# Patient Record
Sex: Female | Born: 1975
Health system: Southern US, Community
[De-identification: ages and names within clinical notes are randomized; demographics above are authoritative.]

## PROBLEM LIST (undated history)

## (undated) DIAGNOSIS — Z789 Other specified health status: Secondary | ICD-10-CM

## (undated) HISTORY — PX: NO PAST SURGERIES: SHX2092

## (undated) HISTORY — DX: Other specified health status: Z78.9

---

## 2019-01-06 NOTE — L&D Delivery Note (Addendum)
Operative Delivery Note At  a viable female was delivered via .  Presentation: vertex; Position: Occiput,, Posterior; Station: +3.  Called to room due to maternal exhaustion.   Verbal consent: obtained from patient.  Risks and benefits discussed in detail.  Risks include, but are not limited to the risks of anesthesia, bleeding, infection, damage to maternal tissues, fetal cephalhematoma.  There is also the risk of inability to effect vaginal delivery of the head, or shoulder dystocia that cannot be resolved by established maneuvers, leading to the need for emergency cesarean section.  Baby delivered with 1 contraction, direct OP. Vacuum disengaged after delivery of head. Shoulders eased out. As there was thick meconium and no tone, cord clamped, cut, and baby taken to warmer, where resuscitative efforts begun.  APGAR: 1, 8, 9; weight 8#10oz  .   Placenta status: spontaneous, intact.   Cord:  with the following complications: .  Cord pH: 7.12  Anesthesia:  epidural Instruments: Mity Vac Episiotomy:   Lacerations:   2nd degree Suture Repair: 2.0 vicryl rapide Est. Blood Loss (mL):  600  Mom to postpartum.  Baby to Couplet care / Skin to Skin.  Levie Heritage 11/07/2019, 1:14 PM

## 2019-03-20 ENCOUNTER — Ambulatory Visit
Admission: EM | Admit: 2019-03-20 | Discharge: 2019-03-20 | Disposition: A | Discharge status: Home / Self Care | Payer: Medicaid Other

## 2019-03-20 ENCOUNTER — Encounter: Payer: Self-pay | Admitting: Emergency Medicine

## 2019-03-20 ENCOUNTER — Other Ambulatory Visit: Payer: Self-pay

## 2019-03-20 DIAGNOSIS — Z3201 Encounter for pregnancy test, result positive: Secondary | ICD-10-CM | POA: Diagnosis not present

## 2019-03-20 LAB — POCT URINE PREGNANCY: Preg Test, Ur: POSITIVE — AB

## 2019-03-20 MED ORDER — DOXYLAMINE-PYRIDOXINE 10-10 MG PO TBEC: DELAYED_RELEASE_TABLET | ORAL | 0 refills | Status: DC

## 2019-03-20 NOTE — ED Provider Notes (Signed)
[facility]    CSN: [id number] Arrival date & time: 03/20/19  1058      History   Chief Complaint Chief Complaint  Patient presents with  . Possible Pregnancy    HPI Tracey Hale is a 44 y.o. female.   44 year old female comes in for confirmation pregnancy test.  Patient states actively trying to conceive, and had positive home pregnancy test.  LMP 02/06/2019.  Has had some nausea without vomiting.  Intermittent headaches.  Denies weakness, dizziness, syncope.  Denies abdominal pain.  Denies vaginal bleeding, spotting.  Denies vaginal discharge, itching.     History reviewed. No pertinent past medical history.  There are no problems to display for this patient.   History reviewed. No pertinent surgical history.  OB History   No obstetric history on file.      Home Medications    Prior to Admission medications   Medication Sig Start Date End Date Taking? Authorizing Provider  omeprazole (PRILOSEC) 10 MG capsule Take 10 mg by mouth daily.  03/20/19 Yes [provider]  Doxylamine-Pyridoxine 10-10 MG TBEC Two tablets at bedtime on day 1 and 2; if symptoms persist, take 1 tablet in morning and 2 tablets at bedtime on day 3; if symptoms persist, may increase to 1 tablet in morning, 1 tablet mid-afternoon, and 2 tablets at bedtime on day 4. do not go over 4 tablets a day. 03/20/19   Ok Edwards, PA-C    Family History Family History  Problem Relation Age of Onset  . Hypertension Mother   . Hypertension Father     Social History Social History   Tobacco Use  . Smoking status: Never Smoker  . Smokeless tobacco: Never Used  Substance Use Topics  . Alcohol use: Not Currently  . Drug use: Never     Allergies   Patient has no known allergies.   Review of Systems Review of Systems  Reason unable to perform ROS: See HPI as above.     Physical Exam Triage Vital Signs ED Triage Vitals  Enc Vitals Group     BP 03/20/19 1106 97/67     Pulse  Rate 03/20/19 1106 65     Resp 03/20/19 1106 16     Temp 03/20/19 1106 98.2 F (36.8 C)     Temp Source 03/20/19 1106 Oral     SpO2 03/20/19 1106 99 %     Weight --      Height --      Head Circumference --      Peak Flow --      Pain Score 03/20/19 1111 0     Pain Loc --      Pain Edu? --      Excl. in [place]? --    No data found.  Updated Vital Signs BP 97/67 (BP Location: Left Arm)   Pulse 65   Temp 98.2 F (36.8 C) (Oral)   Resp 16   LMP 02/06/2019   SpO2 99%   Visual Acuity Right Eye Distance:   Left Eye Distance:   Bilateral Distance:    Right Eye Near:   Left Eye Near:    Bilateral Near:     Physical Exam Constitutional:      General: She is not in acute distress.    Appearance: She is well-developed. She is not ill-appearing, toxic-appearing or diaphoretic.  HENT:     Head: Normocephalic and atraumatic.  Eyes:     Conjunctiva/sclera: Conjunctivae normal.  Pupils: Pupils are equal, round, and reactive to light.  Cardiovascular:     Rate and Rhythm: Normal rate and regular rhythm.  Pulmonary:     Effort: Pulmonary effort is normal. No respiratory distress.     Comments: LCTAB Abdominal:     General: Bowel sounds are normal.     Palpations: Abdomen is soft.     Tenderness: There is no abdominal tenderness. There is no right CVA tenderness, left CVA tenderness, guarding or rebound.  Musculoskeletal:     Cervical back: Normal range of motion and neck supple.  Skin:    General: Skin is warm and dry.  Neurological:     Mental Status: She is alert and oriented to person, place, and time.  Psychiatric:        Behavior: Behavior normal.        Judgment: Judgment normal.      UC Treatments / Results  Labs (all labs ordered are listed, but only abnormal results are displayed) Labs Reviewed  POCT URINE PREGNANCY - Abnormal; Notable for the following components:      Result Value   Preg Test, Ur Positive (*)    All other components within normal  limits    EKG   Radiology No results found.  Procedures Procedures (including critical care time)  Medications Ordered in UC Medications - No data to display  Initial Impression / Assessment and Plan / UC Course  I have reviewed the triage vital signs and the nursing notes.  Pertinent labs & imaging results that were available during my care of the patient were reviewed by me and considered in my medical decision making (see chart for details).    Urine pregnancy positive.  Patient to discontinue omeprazole for now, and use antacids for acid reflux, and decrease spicy food at this time.  Can switch to Pepcid if symptoms not well controlled.  Patient to start prenatal vitamins at this time.  Diclegis as needed for nausea/vomiting.  Push fluids.  Otherwise patient to follow-up with OB/GYN for further evaluation and management needed.  Return precautions given.  Patient expresses understanding and agrees to plan.  Final Clinical Impressions(s) / UC Diagnoses   Final diagnoses:  Positive urine pregnancy test    ED Prescriptions    Medication Sig Dispense Auth. Provider   Doxylamine-Pyridoxine 10-10 MG TBEC Two tablets at bedtime on day 1 and 2; if symptoms persist, take 1 tablet in morning and 2 tablets at bedtime on day 3; if symptoms persist, may increase to 1 tablet in morning, 1 tablet mid-afternoon, and 2 tablets at bedtime on day 4. do not go over 4 tablets a day. 60 tablet Belinda Fisher, PA-C     PDMP not reviewed this encounter.   Belinda Fisher, PA-C 03/20/19 1203

## 2019-03-20 NOTE — ED Notes (Signed)
Patient able to ambulate independently  

## 2019-03-20 NOTE — Discharge Instructions (Signed)
Start prenatal vitamins. You can take over the counter antacids (tums, can also check with pharmacist for options) for acid reflux for now and decrease spicy foods. Diclegis as needed for nausea/vomiting. Keep hydrated, urine should be clear to pale yellow in color. Follow up with OB for further management needed. If noticing abdominal pain, vaginal bleeding, go to the [facility] emergency department for further evaluation.

## 2019-03-20 NOTE — ED Triage Notes (Signed)
Pt presents to [facility] for assessment after having a positive home pregnancy test.  LMP 2/1.

## 2019-03-25 ENCOUNTER — Encounter: Payer: Self-pay | Admitting: Physician Assistant

## 2019-03-25 ENCOUNTER — Ambulatory Visit
Admission: EM | Admit: 2019-03-25 | Discharge: 2019-03-25 | Disposition: A | Discharge status: Home / Self Care | Payer: Medicaid Other | Attending: Physician Assistant | Admitting: Physician Assistant

## 2019-03-25 ENCOUNTER — Other Ambulatory Visit: Payer: Self-pay

## 2019-03-25 DIAGNOSIS — Z3A00 Weeks of gestation of pregnancy not specified: Secondary | ICD-10-CM | POA: Diagnosis not present

## 2019-03-25 DIAGNOSIS — O219 Vomiting of pregnancy, unspecified: Secondary | ICD-10-CM

## 2019-03-25 MED ORDER — ONDANSETRON 4 MG PO TBDP: 4.0000 mg | ORAL_TABLET | Freq: Three times a day (TID) | ORAL | 0 refills | Status: DC | PRN

## 2019-03-25 NOTE — Discharge Instructions (Signed)
Discontinue diclegis. Start zofran as needed. Pick up prenatal vitamin to start. Follow up with OBGYN as scheduled. If still with uncontrolled nausea/vomiting despite medicine, abdominal pain, vaginal bleeding, go to the [facility] emergency department for further evaluation.

## 2019-03-25 NOTE — ED Provider Notes (Signed)
[facility]    CSN: [id number] Arrival date & time: 03/25/19  1407      History   Chief Complaint Chief Complaint  Patient presents with  . Nausea  . Emesis During Pregnancy    HPI Tracey Hale is a 44 y.o. female.   44 year old female returns to clinic after being seen 03/20/2019 for the same. At the time, she had confirmation of pregnancy via urine preg test. She was having nausea/vomiting due to pregnancy, but was still able to tolerate oral/fluid intake at times. She was started on diclegis, for which improved symptoms. However, she has had significant side effect with medicine, making her drowsy and "feeling like I'm drunk". She has not been able to work due to this side effects. She has had zofran in the past for her pregnancy, and would like to change medications. Denies abdominal pain, vaginal bleeding.      History reviewed. No pertinent past medical history.  There are no problems to display for this patient.   History reviewed. No pertinent surgical history.  OB History    Gravida  1   Para      Term      Preterm      AB      Living        SAB      TAB      Ectopic      Multiple      Live Births               Home Medications    Prior to Admission medications   Medication Sig Start Date End Date Taking? Authorizing Provider  ondansetron (ZOFRAN ODT) 4 MG disintegrating tablet Take 1 tablet (4 mg total) by mouth every 8 (eight) hours as needed for nausea or vomiting. 03/25/19   Tasia Catchings, Tahjir Silveria V, PA-C  omeprazole (PRILOSEC) 10 MG capsule Take 10 mg by mouth daily.  03/20/19  [provider]    Family History Family History  Problem Relation Age of Onset  . Hypertension Mother   . Hypertension Father     Social History Social History   Tobacco Use  . Smoking status: Never Smoker  . Smokeless tobacco: Never Used  Substance Use Topics  . Alcohol use: Not Currently  . Drug use: Never     Allergies   Patient has no  known allergies.   Review of Systems Review of Systems  Reason unable to perform ROS: See HPI as above.     Physical Exam Triage Vital Signs ED Triage Vitals  Enc Vitals Group     BP 03/25/19 1412 104/70     Pulse Rate 03/25/19 1412 73     Resp 03/25/19 1412 16     Temp 03/25/19 1412 98.3 F (36.8 C)     Temp Source 03/25/19 1412 Oral     SpO2 03/25/19 1412 98 %     Weight --      Height --      Head Circumference --      Peak Flow --      Pain Score 03/25/19 1422 0     Pain Loc --      Pain Edu? --      Excl. in [place]? --    No data found.  Updated Vital Signs BP 104/70 (BP Location: Left Arm)   Pulse 73   Temp 98.3 F (36.8 C) (Oral)   Resp 16   LMP 02/06/2019  SpO2 98%   Physical Exam Constitutional:      General: She is not in acute distress.    Appearance: Normal appearance. She is well-developed. She is not toxic-appearing or diaphoretic.  HENT:     Head: Normocephalic and atraumatic.  Eyes:     Conjunctiva/sclera: Conjunctivae normal.     Pupils: Pupils are equal, round, and reactive to light.  Pulmonary:     Effort: Pulmonary effort is normal. No respiratory distress.     Comments: Speaking in full sentences without difficulty Musculoskeletal:     Cervical back: Normal range of motion and neck supple.  Skin:    General: Skin is warm and dry.  Neurological:     Mental Status: She is alert and oriented to person, place, and time.      UC Treatments / Results  Labs (all labs ordered are listed, but only abnormal results are displayed) Labs Reviewed - No data to display  EKG   Radiology No results found.  Procedures Procedures (including critical care time)  Medications Ordered in UC Medications - No data to display  Initial Impression / Assessment and Plan / UC Course  I have reviewed the triage vital signs and the nursing notes.  Pertinent labs & imaging results that were available during my care of the patient were reviewed by me  and considered in my medical decision making (see chart for details).    Discontinue diclegis. Will switch to zofran. Start prenatal vitamin. Patient to follow up as scheduled for OBGYN appointment. Otherwise, return precautions given. Patient expresses understanding and agrees to plan.  Final Clinical Impressions(s) / UC Diagnoses   Final diagnoses:  Nausea/vomiting in pregnancy   ED Prescriptions    Medication Sig Dispense Auth. Provider   ondansetron (ZOFRAN ODT) 4 MG disintegrating tablet Take 1 tablet (4 mg total) by mouth every 8 (eight) hours as needed for nausea or vomiting. 20 tablet Belinda Fisher, PA-C     PDMP not reviewed this encounter.   Belinda Fisher, PA-C 03/25/19 1427

## 2019-03-25 NOTE — ED Triage Notes (Signed)
Patient presents with continued nausea and vomiting.  She states medicine she was given on 3/15 is not helping.

## 2019-04-17 ENCOUNTER — Other Ambulatory Visit: Payer: Self-pay

## 2019-04-17 ENCOUNTER — Ambulatory Visit (INDEPENDENT_AMBULATORY_CARE_PROVIDER_SITE_OTHER): Payer: Medicaid Other | Admitting: *Deleted

## 2019-04-17 DIAGNOSIS — O0990 Supervision of high risk pregnancy, unspecified, unspecified trimester: Secondary | ICD-10-CM

## 2019-04-17 DIAGNOSIS — O09529 Supervision of elderly multigravida, unspecified trimester: Secondary | ICD-10-CM

## 2019-04-17 MED ORDER — BLOOD PRESSURE KIT DEVI: 1.0000 | 0 refills | Status: DC | PRN

## 2019-04-17 NOTE — Progress Notes (Signed)
I connected with  Verneda Skill on 04/17/19 at  1:30 PM EDT by telephone and verified that I am speaking with the correct person using two identifiers.   I discussed the limitations, risks, security and privacy concerns of performing an evaluation and management service by telephone and the availability of in person appointments. I also discussed with the patient that there may be a patient responsible charge related to this service. The patient expressed understanding and agreed to proceed.  I explained I am completing her New OB Intake today. We discussed Her EDD and that it is based on  sure LMP . I reviewed her allergies, meds, OB History, Medical /Surgical history, and appropriate screenings. She is currently taking zofran as needed. I informed her we do not recommend zofran in the first trimester of pregnancy and offered to order another medication on our protocol. She declines at this time and will discuss with provider at her new ob appointment next week. She states that she prefers zofran because she took it with other pregnancy and it usually works best for her ; although not as good this pregnancy.  I informed her of [facility] services.  I explained I will send her the [employer] app and app was sent to her while on phone.  I explained we will send a blood pressure cuff to [facility] that will fill that prescription and we called [facility] to verify they received her prescription and confirmed they will deliver to her .  I asked her to bring the blood pressure cuff with her to her first ob appointment so we can show her how to use it. Explained  then we will have her take her blood pressure weekly and enter into the app. I explained she will have some visits in office and some virtually. 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 I reviewed her new ob  appointment date/ time with her , our location and to wear mask, no visitors.  I explained she will have a pelvic exam, ob bloodwork, hemoglobin a1C, cbg ,pap, and   genetic testing if desired,- she does want a panorama. I scheduled an [place] at 19 weeks and gave her the appointment. I also offered her genetic counseling due to her age  which she declined She voices understanding.   Emori Mumme,RN  04/17/2019  1:28 PM

## 2019-04-17 NOTE — Patient Instructions (Signed)

## 2019-04-18 NOTE — Progress Notes (Signed)
Patient was assessed by nursing staff during this encounter. I have reviewed the chart and agree with the documentation and plan.  Jaynie Collins, MD 04/18/2019 8:37 AM

## 2019-04-24 ENCOUNTER — Encounter: Payer: Self-pay | Admitting: Obstetrics & Gynecology

## 2019-04-24 ENCOUNTER — Ambulatory Visit (INDEPENDENT_AMBULATORY_CARE_PROVIDER_SITE_OTHER): Payer: Medicaid Other | Admitting: Obstetrics & Gynecology

## 2019-04-24 ENCOUNTER — Other Ambulatory Visit (HOSPITAL_COMMUNITY)
Admission: RE | Admit: 2019-04-24 | Discharge: 2019-04-24 | Disposition: A | Discharge status: Home / Self Care | Payer: Medicaid Other | Source: Ambulatory Visit | Attending: Obstetrics & Gynecology | Admitting: Obstetrics & Gynecology

## 2019-04-24 ENCOUNTER — Other Ambulatory Visit: Payer: Self-pay

## 2019-04-24 VITALS — BP 109/72 | HR 80 | Wt 137.5 lb

## 2019-04-24 DIAGNOSIS — O0991 Supervision of high risk pregnancy, unspecified, first trimester: Secondary | ICD-10-CM

## 2019-04-24 DIAGNOSIS — O99891 Other specified diseases and conditions complicating pregnancy: Secondary | ICD-10-CM

## 2019-04-24 DIAGNOSIS — O09521 Supervision of elderly multigravida, first trimester: Secondary | ICD-10-CM | POA: Diagnosis present

## 2019-04-24 DIAGNOSIS — O0990 Supervision of high risk pregnancy, unspecified, unspecified trimester: Secondary | ICD-10-CM

## 2019-04-24 DIAGNOSIS — Z3A11 11 weeks gestation of pregnancy: Secondary | ICD-10-CM | POA: Diagnosis not present

## 2019-04-24 DIAGNOSIS — R8271 Bacteriuria: Secondary | ICD-10-CM | POA: Diagnosis not present

## 2019-04-24 DIAGNOSIS — O219 Vomiting of pregnancy, unspecified: Secondary | ICD-10-CM

## 2019-04-24 DIAGNOSIS — O3680X0 Pregnancy with inconclusive fetal viability, not applicable or unspecified: Secondary | ICD-10-CM

## 2019-04-24 LAB — POCT URINALYSIS DIP (DEVICE)
Bilirubin Urine: NEGATIVE
Glucose, UA: NEGATIVE mg/dL
Hgb urine dipstick: NEGATIVE
Ketones, ur: NEGATIVE mg/dL
Leukocytes,Ua: NEGATIVE
Nitrite: NEGATIVE
Protein, ur: NEGATIVE mg/dL
Specific Gravity, Urine: >=1.03 (ref 1.005–1.030)
Urobilinogen, UA: 0.2 mg/dL (ref 0.0–1.0)
pH: 6.5 (ref 5.0–8.0)

## 2019-04-24 MED ORDER — ASPIRIN EC 81 MG PO TBEC: 81.0000 mg | DELAYED_RELEASE_TABLET | Freq: Every day | ORAL | 2 refills | Status: DC

## 2019-04-24 MED ORDER — ONDANSETRON 4 MG PO TBDP: 4.0000 mg | ORAL_TABLET | Freq: Three times a day (TID) | ORAL | 2 refills | Status: DC | PRN

## 2019-04-24 NOTE — Patient Instructions (Addendum)
AREA PEDIATRIC/FAMILY PRACTICE PHYSICIANS  [place] ([address]) . [facility] o Chambliss, MD; Eniola, MD; Hale, MD; Hensel, MD; McDiarmid, MD; McIntyer, MD; Neal, MD; Walden, MD o [address] [place], [place] [address] o [phone] o Mon-Fri 8:30-12:30, 1:30-5:00 o Providers come to see babies at [facility] o Accepting Medicaid . [facility] o Limited providers who accept newborns: Koirala, MD; Morrow, MD; Wolters, MD o [address], [place], [place] [address] o [phone] o Mon-Fri 8:00-5:30 o Babies seen by providers at [facility] o Does NOT accept Medicaid o Please call early in hospitalization for appointment (limited availability)  . [facility] o Mulberry, MD o [address] [place], [place] [address] o [phone] o Mon, Tue, Thur, Fri 8:30-5:00, Wed 10:00-7:00 (closed 1-2pm) o Babies seen by [facility] providers o Accepting Medicaid . [facility] - Pediatrician o Rubin, MD o [address], [place], [place] [address] o [phone] o Mon-Fri 8:30-5:00, Sat 8:30-12:00 o Provider comes to see babies at [facility] o Accepting Medicaid o Must have been referred from current patients or contacted office prior to delivery . [facility] ([facility]) o Brown, MD; Chandler, MD; Ettefagh, MD; Grant, MD; Lester, MD; McCormick, MD; McQueen, MD; Prose, MD; Simha, MD; Stanley, MD; Stryffeler, NP; Tebben, NP o [address], [place], [place] [address] o [phone] o Mon, Tue, Thur, Fri 8:30-5:30, Wed 9:30-5:30, Sat 8:30-12:30 o Babies seen by [facility] providers o Accepting Medicaid o Only accepting infants of first-time parents or siblings of current patients o Hospital discharge coordinator will make follow-up appointment . Jack Amos o [address],  [place], [place]  [address] o [phone]   Fax - [phone] . [facility] o [address], [place], [place]  [address] o Phone - [phone]   Fax - [phone] . Shilpa Gosrani o [address], [place], [place]  [address] o [phone]  [place] ([address]) . [facility] o Bates, MD; Brassfield, MD; Cooper, Cox, MD; MD; Davis, MD; Dovico, MD; Ettefaugh, MD; Little, MD; Lowe, MD; Keiffer, MD; Melvin, MD; Sumner, MD; Williams, MD o [address], [place], [place] [address] o [phone] o Mon-Fri 8:30-5:00 (extended evenings Mon-Thur as needed), Sat-Sun 10:00-1:00 o Providers come to see babies at [facility] o Accepting Medicaid for families of first-time babies and families with all children in the household age 3 and under. Must register with office prior to making appointment (M-F only). . [facility] o Henson, NP; Knapp, MD; Lalonde, MD; Tysinger, PA o [address] [place], [place] [address] o [phone] o Mon-Fri 8:00-5:00 o Babies seen by providers at [facility] o Does NOT accept Medicaid/Commercial Insurance Only . [facility] ([facility]  o Artis, MD; Barnes, MD; Bratton, MD; Coccaro, MD; Lockett Gardner, MD; Kramer, MD; Marshall, MD; Netherton, MD; Poleto, MD; Skinner, MD o [address] [place], [place] [address] o [phone] o Mon-Fri 8:30-5:30, Sat (Oct.-Mar.) 9:00-1:00 o Babies seen by providers at [facility] o Accepting Medicaid  [place] ([address]) . [facility] o Reid, MD; Warner, MD o [address], [place],  [address] o [phone] o Mon-Fri 8:30-5:00, Sat 8:30-12:00 o Providers come to see babies at [facility] o Does NOT accept Medicaid . [facility] o Becker, PA; Hagler, MD; Scifres, PA; Sun, MD; Swayne, MD o [address],  [place], [place] [address] o [phone] o Mon-Fri 8:00-5:00 o Babies seen by providers at [facility] o Does NOT accept Medicaid o Only accepting babies of parents who are patients o Please call early in hospitalization for appointment (limited availability) . [facility] o Clark, MD; Frye, MD; Kelleher, MD; Mack, NP; Miller, MD; O'Keller, MD; Patterson, NP; Pudlo, MD; Puzio, MD; Thomas, MD; Tucker, MD; Twiselton, MD o [address], [place], [place] [address] o [phone] o Mon-Fri 8:00-5:00, Sat 9:00-12:00 o Providers come to see babies at [facility] o Does NOT accept Medicaid  [place] ([address]) . [facility] o Limited providers accepting new patients: Brake, NP; Wharton, PA o [address], [place], [place] [address] o [phone] o Mon-Fri 8:00-5:00 o Babies seen by providers at [facility] o Does NOT accept Medicaid o Only accepting babies of parents who are patients o Please call early in hospitalization for appointment (limited availability) . [facility] o Gay, MD; Quinlan, MD o [address] [place], [place] [address] o [phone] (press 1 to schedule appointment) o Mon-Fri 8:00-5:00 o Providers come to see babies at [facility] o Does NOT accept Medicaid . [facility] o Mazer, MD o [address] [place], [place] [address] o [phone] o Mon-Fri 8:30-5:00 (lunch 12:30-1:00), extended hours by appointment only Wed 5:00-6:30 o Babies seen by [facility] providers o Accepting Medicaid . [facility] o Banks, MD; Jordan, MD; Koberlein, MD o [address], [place], [place] [address] o [phone] o Mon-Fri 8:00-5:00 o Babies seen by [facility] providers o Does NOT accept Medicaid . [facility] o Parker, MD; Hunter, MD; Wallace, DO o [address] [place], [place]  [address] o [phone] o Mon-Fri 8:00-5:00 o Babies seen by [facility] providers o Does NOT accept Medicaid . [facility] o Brandon, PA; Brecken, PA; Christy, NP; Dees, MD; DeClaire, MD; DeWeese, MD; Hansen, NP; Mills, NP; Parrish, NP; Smoot, NP; Summer, MD; Vapne, MD o [address] [place], [place] [address] o [phone] o Mon-Fri 8:30-5:00, Sat 10:00-1:00 o Providers come to see babies at [facility] o Does NOT accept Medicaid o Free prenatal information session Tuesdays at 4:45pm . [facility] o Bouska, MD; Gordon, PA; Jeffery, PA; Weber, PA o [address] [place] [address] o [phone] o Mon-Fri 7:30-5:30 o Babies seen by [facility] providers . [facility] o [address], [place], [place], [place]  [address] o [phone]   Fax - [phone]  [place] ([phone]) . [facility] o Reese, MD o [address] [place], [place] [address] o [phone] o Mon-Thur 8:00-6:00 o Providers come to see babies at [facility] o Accepting Medicaid . [facility] o Anderson, NP; Badger, MD; Beal, PA; Spencer, PA o [address] [place],  [address] o [phone] o Mon-Thur 7:30-7:30, Fri 7:30-4:30 o Babies seen by [facility] providers o Accepting Medicaid . [facility] o Agbuya, MD; Klett, NP; Romgoolam, MD o [address], ,  [address] o [phone] o Mon-Fri 8:30-5:00, Sat 8:30-12:00 o Providers come to see babies at [facility] o Accepting Medicaid o Must have "Meet & Greet" appointment at office prior to delivery . [facility]  ([facility] ) o McCord,   MD; Juleen China, MD; Clydene Laming, [address], [place], [place] [address] o [phone] o Mon-Wed 8:00-6:00, Thur-Fri 8:00-5:00, Sat 9:00-12:00 o Providers come to  see babies at [facility] o Does NOT accept Medicaid o Only accepting siblings of current patients . [facility] [place], [place], [place], [place]  [address] o [phone]   Fax [phone] . [facility] [place] [address], [place], [place]  [address] o [phone]   Fax - Morton Gorman [phone] & [phone]) . Therapist, music at [place], DO; [place], [address] [place], [place] [address] o [phone] o Mon-Fri 7:00-5:00 o Babies seen by [facility] providers o Does NOT accept Medicaid . [place], MD; [place], [place]; [place], [place], [place], [place] [address] o [phone] o Mon-Fri 8:00-5:00 o Babies seen by [facility] providers o Accepting Medicaid . Lamont, MD; [place], [place]; [place], NP; [place], [place], [place], [place] [address] o [phone] o Mon-Fri 8:00-5:00 o Babies seen by providers at [facility] [place] [phone]) . [facility], [place] o [address] [place], [place] [address] o [phone] o Mon-Fri 8:00-5:00 o Babies seen by [facility] providers o Does NOT accept Medicaid o Limited availability, please call early in hospitalization to schedule follow-up . [facility] Leilani Merl, PA; Maisie Fus, MD; [place], MD; [place], [place]; Jeannine Kitten, MD; [place], [place] [facility] [address], [place], [place] [address] o [phone] o Mon-Fri 8:30-5:00, Sat 9:00-12:00 o Babies seen by providers at [facility] o Accepting Medicaid o Please register online then schedule online or call office o [email or web address] . [place] ([facility]  [place]) Kristian Covey, NP; Dwyane Dee, MD; Leonidas Romberg, PA o [address] [place], [place], [place] [address] o [phone] o Mon-Fri 8:00-5:00 o Babies seen by providers at [facility] o Accepting Medicaid . [place] ([facility]) Dairl Ponder, MD; Rayvon Char, NP; Melina Modena, MD o [address] [place], [place], [place] [address] o [phone] o Mon-Fri 8:00-5:30, Sat&Sun by appointment (phones open at 8:30) o Babies seen by [facility] providers o Accepting Medicaid o Must be a first-time baby or sibling of current patient . [place], [address], [place], [place]  [address] o [phone]   Fax - [phone]  [place] [phone] & [phone]) . [place], [place]; [place], [place]; Benjamine Mola, MD; [place], [place]; Harrell Lark, MD o [address] [place], [place] [address] o [phone] o Mon-Thur 8:00-7:00, Fri 8:00-5:00, Sat 8:00-12:00, Sun 9:00-12:00 o Babies seen by [facility] providers o Accepting Medicaid . [facility], MD; Ruthann Cancer, MD; [facility], MD o [address], [place], [place] [address] o [phone] o Mon-Thur 8:00-5:00 o Babies seen by providers at [facility] o Accepting Medicaid . [facility] [place], MD; Coe-Goins, MD; Amedeo Plenty, MD; Bobby Rumpf, MD; List, MD; Lavonia Drafts, MD; Ruthann Cancer, MD; Selinda Eon, MD; Audie Box, MD; Jim Like, MD; Christie Nottingham, MD; Hubbard Hartshorn, MD; Modena Nunnery, MD o [address] [place], [place]  [address] o [phone] o Mon-Fri 8:00-5:30, Sat (Oct.-Mar.) 9:00-1:00 o Babies seen by providers at [facility] o Accepting Medicaid o Must fill out new patient packet, available online at [email or web address] . [place] ([facility]) Barnabas Lister, NP; [place]on Kingfisher, NP; Claiborne Billings, NP; Rolla Plate, MD;  [place], [place]; Carola Rhine, MD; Tyron Russell, MD; Delia Chimes, NP o [address], [place], [place] [address] o [phone] o Mon-Thur 8:00-5:30, Fri 8:00-5:00 o Babies seen by providers at [place] [phone]) . [place], [place]; [place]inton, MD; Dennard Schaumann, MD; [place], [place] o [address] [place], [place] [address] o [phone] o Mon-Fri 8:00-5:00 o Babies seen by providers at [place] [phone]) . [place] [facility] [place], [place]; Olen Pel, MD; [place], [place], [place], [place] [address] o [phone] o Mon-Fri 8:00-5:00 o Babies seen by providers at [facility] o Does NOT accept Medicaid o Limited appointment availability, please call early in hospitalization  . Therapist, music at [place], [place]; [place], [place], [place] [address] o [phone] o Mon-Fri 8:00-5:00 o Babies seen by [facility] providers o Does NOT accept Medicaid . [facility] Su Grand, MD; Guy Sandifer, MD; [place], [place]; [place], [place] [address], [place], [place] [address] o [phone] o Mon-Fri 8:00-5:00 o After hours clinic [facility] [place], [place] [address]) [phone] Mon-Fri 5:00-8:00, Sat 12:00-6:00, Sun 10:00-4:00 o Babies seen by [facility] providers o Accepting Medicaid . [place] at [facility] o [address] [place], [place]  [address] o [phone]   Fax - [phone]  [place] [phone]) . Therapist, music at [facility], MD o [address] [place], [place], [place] [address] o [phone] o Mon-Fri 8:00-5:00 o Babies seen by [facility] providers o Does NOT accept Medicaid . [place] ([place] [facility] [place]) Bing Neighbors, MD o [address], [place], [place]  [address] o [phone] o Mon-Thur 8:00-7:00, Fri 8:00-5:00, Sat 8:00-12:00 o Babies seen by providers at [facility] o Accepting Medicaid - but does not have vaccinations in office (must be received elsewhere) o Limited availability, please call early in hospitalization  [place] ([address]) . [place], [place], [place] [address] o [phone]  Fax [phone]  First Trimester of Pregnancy The first trimester of pregnancy is from week 1 until the end of week 13 (months 1 through 3). A week after a sperm fertilizes an egg, the egg will implant on the wall of the uterus. This embryo will begin to develop into a baby. Genes from you and your partner will form the baby. The female genes will determine whether the baby will be a boy or a girl. At 6-8 weeks, the eyes and face will be formed, and the heartbeat can be seen on ultrasound. At the end of 12 weeks, all the baby's organs will be formed. Now that you are pregnant, you will want to do everything you can to have a healthy baby. Two of the most important things are to get good prenatal care and to follow your health care provider's instructions. Prenatal care is all the medical care you receive before the baby's birth. This care will help prevent,  find, and treat any problems during the pregnancy and childbirth. Body changes during your first trimester Your body goes through many changes during pregnancy. The changes vary from woman to woman.  You may gain or lose a couple of pounds at first.  You may feel sick to your stomach (nauseous) and you may throw up (vomit). If the vomiting is uncontrollable, call your health care provider.  You may tire easily.  You may develop headaches that can be relieved by medicines. All medicines should be approved by your health care provider.  You may urinate more often. Painful urination may mean you have a bladder infection.  You may develop  heartburn as a result of your pregnancy.  You may develop constipation because certain hormones are causing the muscles that push stool through your intestines to slow down.  You may develop hemorrhoids or swollen veins (varicose veins).  Your breasts may begin to grow larger and become tender. Your nipples may stick out more, and the tissue that surrounds them (areola) may become darker.  Your gums may bleed and may be sensitive to brushing and flossing.  Dark spots or blotches (chloasma, mask of pregnancy) may develop on your face. This will likely fade after the baby is born.  Your menstrual periods will stop.  You may have a loss of appetite.  You may develop cravings for certain kinds of food.  You may have changes in your emotions from day to day, such as being excited to be pregnant or being concerned that something may go wrong with the pregnancy and baby.  You may have more vivid and strange dreams.  You may have changes in your hair. These can include thickening of your hair, rapid growth, and changes in texture. Some women also have hair loss during or after pregnancy, or hair that feels dry or thin. Your hair will most likely return to normal after your baby is born. What to expect at prenatal visits During a routine prenatal visit:  You will be weighed to make sure you and the baby are growing normally.  Your blood pressure will be taken.  Your abdomen will be measured to track your baby's growth.  The fetal heartbeat will be listened to between weeks 10 and 14 of your pregnancy.  Test results from any previous visits will be discussed. Your health care provider may ask you:  How you are feeling.  If you are feeling the baby move.  If you have had any abnormal symptoms, such as leaking fluid, bleeding, severe headaches, or abdominal cramping.  If you are using any tobacco products, including cigarettes, chewing tobacco, and electronic cigarettes.  If you have  any questions. Other tests that may be performed during your first trimester include:  Blood tests to find your blood type and to check for the presence of any previous infections. The tests will also be used to check for low iron levels (anemia) and protein on red blood cells (Rh antibodies). Depending on your risk factors, or if you previously had diabetes during pregnancy, you may have tests to check for high blood sugar that affects pregnant women (gestational diabetes).  Urine tests to check for infections, diabetes, or protein in the urine.  An ultrasound to confirm the proper growth and development of the baby.  Fetal screens for spinal cord problems (spina bifida) and Down syndrome.  HIV (human immunodeficiency virus) testing. Routine prenatal testing includes screening for HIV, unless you choose not to have this test.  You may need other tests to make sure you and the baby are doing well. Follow these instructions at home: Medicines  Follow your health care provider's instructions regarding medicine use. Specific medicines may be either safe or unsafe to take during pregnancy.  Take a prenatal vitamin that contains at least 600 micrograms (mcg) of folic acid.  If you develop constipation, try taking a stool softener if your health care provider approves. Eating and drinking   Eat a balanced diet that includes fresh fruits and vegetables, whole grains, good sources of protein such as meat, eggs, or tofu, and low-fat dairy. Your health care provider will help you determine the amount of weight gain that is right for you.  Avoid raw meat and uncooked cheese. These carry germs that can cause birth defects in the baby.  Eating four or five small meals rather than three large meals a day may help relieve nausea and vomiting. If you start to feel nauseous, eating a few soda crackers can be helpful. Drinking liquids between meals, instead of during meals, also seems to help ease nausea  and vomiting.  Limit foods that are high in fat and processed sugars, such as fried and sweet foods.  To prevent constipation: ? Eat foods that are high in fiber, such as fresh fruits and vegetables, whole grains, and beans. ? Drink enough fluid to keep your urine clear or pale yellow. Activity  Exercise only as directed by your health care provider. Most women can continue their usual exercise routine during pregnancy. Try to exercise for 30 minutes at least 5 days a week. Exercising will help you: ? Control your weight. ? Stay in shape. ? Be prepared for labor and delivery.  Experiencing pain or cramping in the lower abdomen or lower back is a good sign that you should stop exercising. Check with your health care provider before continuing with normal exercises.  Try to avoid standing for long periods of time. Move your legs often if you must stand in one place for a long time.  Avoid heavy lifting.  Wear low-heeled shoes and practice good posture.  You may continue to have sex unless your health care provider tells you not to. Relieving pain and discomfort  Wear a good support bra to relieve breast tenderness.  Take warm sitz baths to soothe any pain or discomfort caused by hemorrhoids. Use hemorrhoid cream if your health care provider approves.  Rest with your legs elevated if you have leg cramps or low back pain.  If you develop varicose veins in your legs, wear support hose. Elevate your feet for 15 minutes, 3-4 times a day. Limit salt in your diet. Prenatal care  Schedule your prenatal visits by the twelfth week of pregnancy. They are usually scheduled monthly at first, then more often in the last 2 months before delivery.  Write down your questions. Take them to your prenatal visits.  Keep all your prenatal visits as told by your health care provider. This is important. Safety  Wear your seat belt at all times when driving.  Make a list of emergency phone numbers,  including numbers for family, friends, the hospital, and police and fire departments. General instructions  Ask your health care provider for a referral to a local prenatal education class. Begin classes no later than the beginning of month 6 of your pregnancy.  Ask for help if you have counseling or nutritional needs during pregnancy. Your health care provider can offer advice or refer you  to specialists for help with various needs.  Do not use hot tubs, steam rooms, or saunas.  Do not douche or use tampons or scented sanitary pads.  Do not cross your legs for long periods of time.  Avoid cat litter boxes and soil used by cats. These carry germs that can cause birth defects in the baby and possibly loss of the fetus by miscarriage or stillbirth.  Avoid all smoking, herbs, alcohol, and medicines not prescribed by your health care provider. Chemicals in these products affect the formation and growth of the baby.  Do not use any products that contain nicotine or tobacco, such as cigarettes and e-cigarettes. If you need help quitting, ask your health care provider. You may receive counseling support and other resources to help you quit.  Schedule a dentist appointment. At home, brush your teeth with a soft toothbrush and be gentle when you floss. Contact a health care provider if:  You have dizziness.  You have mild pelvic cramps, pelvic pressure, or nagging pain in the abdominal area.  You have persistent nausea, vomiting, or diarrhea.  You have a bad smelling vaginal discharge.  You have pain when you urinate.  You notice increased swelling in your face, hands, legs, or ankles.  You are exposed to fifth disease or chickenpox.  You are exposed to [place] measles (rubella) and have never had it. Get help right away if:  You have a fever.  You are leaking fluid from your vagina.  You have spotting or bleeding from your vagina.  You have severe abdominal cramping or  pain.  You have rapid weight gain or loss.  You vomit blood or material that looks like coffee grounds.  You develop a severe headache.  You have shortness of breath.  You have any kind of trauma, such as from a fall or a car accident. Summary  The first trimester of pregnancy is from week 1 until the end of week 13 (months 1 through 3).  Your body goes through many changes during pregnancy. The changes vary from woman to woman.  You will have routine prenatal visits. During those visits, your health care provider will examine you, discuss any test results you may have, and talk with you about how you are feeling. This information is not intended to replace advice given to you by your health care provider. Make sure you discuss any questions you have with your health care provider. Document Revised: 12/04/2016 Document Reviewed: 12/04/2015 Elsevier Patient Education  2020 [employer].

## 2019-04-24 NOTE — Progress Notes (Signed)
History:   Tracey Hale is a 44 y.o. P7T0626 at 24w0dby LMP being seen today for her first obstetrical visit.  Her obstetrical history is significant for advanced maternal age and two term uncomplicated pregnancies culminating in vaginal deliveries. Patient does intend to breast feed. Pregnancy history fully reviewed.  Patient reports nausea. Desires refill of Zofran, this works well for her.       HISTORY: OB History  Gravida Para Term Preterm AB Living  '4 2 2 '$ 0 1 2  SAB TAB Ectopic Multiple Live Births  1 0 0 0 2    # Outcome Date GA Lbr Len/2nd Weight Sex Delivery Anes PTL Lv  4 Current           3 Term 11/15/10 443w0d8 lb 7 oz (3.827 kg) M Vag-Spont EPI  LIV     Birth Comments: wnl  2 Term 12/2007 4015w0d lb 15 oz (4.054 kg) M Vag-Spont EPI  LIV     Birth Comments: wnl  1 SAB 2007 5w062w0d       Last pap smear was done in 2019 in [place] was normal  History reviewed. No pertinent past medical history. History reviewed. No pertinent surgical history. Family History  Problem Relation Age of Onset  . Hypertension Mother   . Hypertension Father    Social History   Tobacco Use  . Smoking status: Never Smoker  . Smokeless tobacco: Never Used  Substance Use Topics  . Alcohol use: Not Currently    Comment: occasionally  . Drug use: Never   No Known Allergies Current Outpatient Medications on File Prior to Visit  Medication Sig Dispense Refill  . Prenatal MV & Min w/FA-DHA (PRENATAL ADULT GUMMY/DHA/FA PO) Take 1 tablet by mouth.    . Blood Pressure Monitoring (BLOOD PRESSURE KIT) DEVI 1 Device by Does not apply route as needed. (Patient not taking: Reported on 04/24/2019) 1 each 0  . [DISCONTINUED] omeprazole (PRILOSEC) 10 MG capsule Take 10 mg by mouth daily.     No current facility-administered medications on file prior to visit.    Review of Systems Pertinent items noted in HPI and remainder of comprehensive ROS otherwise negative. Physical Exam:    Vitals:   04/24/19 0903  BP: 109/72  Pulse: 80  Weight: 137 lb 8 oz (62.4 kg)      Bedside Ultrasound for FHR check: 160 bpm Patient informed that the ultrasound is considered a limited obstetric ultrasound and is not intended to be a complete ultrasound exam.  Patient also informed that the ultrasound is not being completed with the intent of assessing for fetal or placental anomalies or any pelvic abnormalities.  Explained that the purpose of today's ultrasound is to assess for fetal heart rate.  Patient acknowledges the purpose of the exam and the limitations of the study.  Uterus:     Pelvic Exam: Perineum: no hemorrhoids, normal perineum   Vulva: normal external genitalia, no lesions   Vagina:  normal mucosa, normal discharge   Cervix: no lesions and normal, pap smear done, has ectropion and had mild bleeding after pap   Adnexa: normal adnexa and no mass, fullness, tenderness   Bony Pelvis: average  System: General: well-developed, well-nourished female in no acute distress   Breasts:  normal appearance, no masses or tenderness bilaterally   Skin: normal coloration and turgor, no rashes   Neurologic: oriented, normal, negative, normal mood   Extremities: normal strength, tone,  and muscle mass, ROM of all joints is normal   HEENT PERRLA, extraocular movement intact and sclera clear, anicteric   Mouth/Teeth mucous membranes moist, pharynx normal without lesions and dental hygiene good   Neck supple and no masses   Cardiovascular: regular rate and rhythm   Respiratory:  no respiratory distress, normal breath sounds   Abdomen: soft, non-tender; bowel sounds normal; no masses,  no organomegaly    Assessment:    Pregnancy: [id number] Patient Active Problem List   Diagnosis Date Noted  . Supervision of high risk pregnancy, antepartum 04/17/2019  . AMA (advanced maternal age) multigravida 40+ 04/17/2019     Plan:    1. Nausea and vomiting during pregnancy Zofran refilled -  ondansetron (ZOFRAN ODT) 4 MG disintegrating tablet; Take 1 tablet (4 mg total) by mouth every 8 (eight) hours as needed for nausea or vomiting.  Dispense: 20 tablet; Refill: 2  2. Multigravida of advanced maternal age in first trimester Desires [employer] and first trimester anatomy scan. - Genetic Screening - US MFM Fetal Nuchal Translucency; Future  3. Supervision of high risk pregnancy, antepartum - Obstetric Panel, Including HIV - Culture, OB Urine - Genetic Screening - Hepatitis c antibody (reflex) - Cervicovaginal ancillary only - Comprehensive metabolic panel - Hemoglobin A1c - TSH - Protein / creatinine ratio, urine - [place] MFM OB DETAIL +14 WK; Future - Cytology - PAP - aspirin EC 81 MG tablet; Take 1 tablet (81 mg total) by mouth daily. Take after 12 weeks for prevention of preeclampsia later in pregnancy  Dispense: 300 tablet; Refill: 2 - [place] MFM Fetal Nuchal Translucency; Future  Initial labs drawn. Continue prenatal vitamins. Genetic Screening discussed, First trimester anatomy scan and NIPS: ordered. Ultrasound discussed; fetal anatomic survey: ordered. Problem list reviewed and updated. The nature of [place] with multiple MDs and other Advanced Practice Providers was explained to patient; also emphasized that residents, students are part of our team. Routine obstetric precautions reviewed. Return in about 4 weeks (around 05/22/2019) for OFFICE HOB Visit and AFP screen only.     Verita Schneiders, MD, [place] for [employer], [place]

## 2019-04-25 ENCOUNTER — Encounter: Payer: Self-pay | Admitting: *Deleted

## 2019-04-25 DIAGNOSIS — R87612 Low grade squamous intraepithelial lesion on cytologic smear of cervix (LGSIL): Secondary | ICD-10-CM | POA: Insufficient documentation

## 2019-04-25 LAB — CERVICOVAGINAL ANCILLARY ONLY
Bacterial Vaginitis (gardnerella): NEGATIVE
Candida Glabrata: NEGATIVE
Candida Vaginitis: NEGATIVE
Chlamydia: NEGATIVE
Comment: NEGATIVE
Comment: NEGATIVE
Comment: NEGATIVE
Comment: NEGATIVE
Comment: NEGATIVE
Comment: NORMAL
Neisseria Gonorrhea: NEGATIVE
Trichomonas: NEGATIVE

## 2019-04-25 LAB — HEMOGLOBIN A1C
Est. average glucose Bld gHb Est-mCnc: 103 mg/dL
Hgb A1c MFr Bld: 5.2 % (ref 4.8–5.6)

## 2019-04-25 LAB — OBSTETRIC PANEL, INCLUDING HIV
Antibody Screen: NEGATIVE
Basophils Absolute: 0.1 10*3/uL (ref 0.0–0.2)
Basos: 1 %
EOS (ABSOLUTE): 0.3 10*3/uL (ref 0.0–0.4)
Eos: 4 %
HIV Screen 4th Generation wRfx: NONREACTIVE
Hematocrit: 33.1 % — ABNORMAL LOW (ref 34.0–46.6)
Hemoglobin: 11 g/dL — ABNORMAL LOW (ref 11.1–15.9)
Hepatitis B Surface Ag: NEGATIVE
Immature Grans (Abs): 0 10*3/uL (ref 0.0–0.1)
Immature Granulocytes: 1 %
Lymphocytes Absolute: 1.6 10*3/uL (ref 0.7–3.1)
Lymphs: 21 %
MCH: 28.2 pg (ref 26.6–33.0)
MCHC: 33.2 g/dL (ref 31.5–35.7)
MCV: 85 fL (ref 79–97)
Monocytes Absolute: 0.8 10*3/uL (ref 0.1–0.9)
Monocytes: 10 %
Neutrophils Absolute: 4.9 10*3/uL (ref 1.4–7.0)
Neutrophils: 63 %
Platelets: 299 10*3/uL (ref 150–450)
RBC: 3.9 x10E6/uL (ref 3.77–5.28)
RDW: 13.9 % (ref 11.7–15.4)
RPR Ser Ql: NONREACTIVE
Rh Factor: POSITIVE
Rubella Antibodies, IGG: 11.1 index (ref >0.99)
WBC: 7.7 10*3/uL (ref 3.4–10.8)

## 2019-04-25 LAB — COMPREHENSIVE METABOLIC PANEL
ALT: 8 IU/L (ref 0–32)
AST: 15 IU/L (ref 0–40)
Albumin/Globulin Ratio: 1.5 (ref 1.2–2.2)
Albumin: 4.1 g/dL (ref 3.8–4.8)
Alkaline Phosphatase: 39 IU/L (ref 39–117)
BUN/Creatinine Ratio: 13 (ref 9–23)
BUN: 9 mg/dL (ref 6–24)
Bilirubin Total: 0.3 mg/dL (ref 0.0–1.2)
CO2: 20 mmol/L (ref 20–29)
Calcium: 9.1 mg/dL (ref 8.7–10.2)
Chloride: 102 mmol/L (ref 96–106)
Creatinine, Ser: 0.69 mg/dL (ref 0.57–1.00)
GFR calc Af Amer: 124 mL/min/{1.73_m2} (ref >59)
GFR calc non Af Amer: 108 mL/min/{1.73_m2} (ref >59)
Globulin, Total: 2.8 g/dL (ref 1.5–4.5)
Glucose: 78 mg/dL (ref 65–99)
Potassium: 3.8 mmol/L (ref 3.5–5.2)
Sodium: 136 mmol/L (ref 134–144)
Total Protein: 6.9 g/dL (ref 6.0–8.5)

## 2019-04-25 LAB — PROTEIN / CREATININE RATIO, URINE
Creatinine, Urine: 116.8 mg/dL
Protein, Ur: 9.7 mg/dL
Protein/Creat Ratio: 83 mg/g creat (ref 0–200)

## 2019-04-25 LAB — HEPATITIS C ANTIBODY (REFLEX): HCV Ab: <0.1 s/co ratio (ref 0.0–0.9)

## 2019-04-25 LAB — HCV COMMENT:

## 2019-04-25 LAB — TSH: TSH: 1.27 u[IU]/mL (ref 0.450–4.500)

## 2019-04-27 ENCOUNTER — Encounter: Payer: Self-pay | Admitting: Obstetrics & Gynecology

## 2019-04-27 LAB — CYTOLOGY - PAP
Comment: NEGATIVE
Comment: NEGATIVE
Comment: NEGATIVE
HPV 16: NEGATIVE
HPV 18 / 45: NEGATIVE
High risk HPV: POSITIVE — AB

## 2019-04-28 LAB — CULTURE, OB URINE

## 2019-04-28 LAB — URINE CULTURE, OB REFLEX

## 2019-04-28 MED ORDER — CEFADROXIL 500 MG PO CAPS: 500.0000 mg | ORAL_CAPSULE | Freq: Two times a day (BID) | ORAL | 0 refills | Status: DC

## 2019-04-28 NOTE — Addendum Note (Signed)
Addended by: Jaynie Collins A on: 04/28/2019 03:30 PM   Modules accepted: Orders

## 2019-05-02 ENCOUNTER — Encounter: Payer: Self-pay | Admitting: *Deleted

## 2019-05-02 ENCOUNTER — Telehealth: Payer: Self-pay

## 2019-05-02 NOTE — Telephone Encounter (Addendum)
-----   Message from Tereso Newcomer, MD sent at 04/28/2019  3:30 PM EDT ----- Urine culture showed bacteria in urine, antibiotic therapy (Cefadroxil) prescribed.  Please call to inform patient of results and advise her to pick up prescription and take as directed. Pt also needs to be informed that she needs a colposcopy which will be done at her appt on 05/22/19.  LM for pt that she has a UTI and an antibiotic has been sent to her [facility] on [address].  We also need to inform her of other results if she could please contact the office.  MyChart message sent.

## 2019-05-03 DIAGNOSIS — Z23 Encounter for immunization: Secondary | ICD-10-CM | POA: Diagnosis not present

## 2019-05-10 ENCOUNTER — Ambulatory Visit: Payer: Medicaid Other | Admitting: *Deleted

## 2019-05-10 ENCOUNTER — Ambulatory Visit: Payer: Medicaid Other | Attending: Obstetrics & Gynecology

## 2019-05-10 ENCOUNTER — Other Ambulatory Visit: Payer: Self-pay

## 2019-05-10 ENCOUNTER — Other Ambulatory Visit: Payer: Self-pay | Admitting: Obstetrics & Gynecology

## 2019-05-10 ENCOUNTER — Ambulatory Visit: Payer: Medicaid Other

## 2019-05-10 VITALS — BP 100/62 | HR 73 | Temp 98.3°F

## 2019-05-10 DIAGNOSIS — O09521 Supervision of elderly multigravida, first trimester: Secondary | ICD-10-CM | POA: Diagnosis not present

## 2019-05-10 DIAGNOSIS — Z3A13 13 weeks gestation of pregnancy: Secondary | ICD-10-CM

## 2019-05-10 DIAGNOSIS — O0990 Supervision of high risk pregnancy, unspecified, unspecified trimester: Secondary | ICD-10-CM

## 2019-05-10 DIAGNOSIS — Z3682 Encounter for antenatal screening for nuchal translucency: Secondary | ICD-10-CM

## 2019-05-10 DIAGNOSIS — Z363 Encounter for antenatal screening for malformations: Secondary | ICD-10-CM

## 2019-05-15 ENCOUNTER — Encounter: Payer: Self-pay | Admitting: *Deleted

## 2019-05-22 ENCOUNTER — Ambulatory Visit (INDEPENDENT_AMBULATORY_CARE_PROVIDER_SITE_OTHER): Payer: Medicaid Other | Admitting: Family Medicine

## 2019-05-22 ENCOUNTER — Other Ambulatory Visit: Payer: Self-pay

## 2019-05-22 ENCOUNTER — Encounter: Payer: Self-pay | Admitting: Family Medicine

## 2019-05-22 VITALS — BP 103/66 | HR 74 | Wt 141.9 lb

## 2019-05-22 DIAGNOSIS — Z3A15 15 weeks gestation of pregnancy: Secondary | ICD-10-CM

## 2019-05-22 DIAGNOSIS — R87612 Low grade squamous intraepithelial lesion on cytologic smear of cervix (LGSIL): Secondary | ICD-10-CM | POA: Diagnosis not present

## 2019-05-22 DIAGNOSIS — O0992 Supervision of high risk pregnancy, unspecified, second trimester: Secondary | ICD-10-CM | POA: Diagnosis not present

## 2019-05-22 DIAGNOSIS — O0990 Supervision of high risk pregnancy, unspecified, unspecified trimester: Secondary | ICD-10-CM

## 2019-05-22 DIAGNOSIS — O09522 Supervision of elderly multigravida, second trimester: Secondary | ICD-10-CM

## 2019-05-22 NOTE — Progress Notes (Signed)
   PRENATAL VISIT NOTE  Subjective:  Tracey Hale is a 44 y.o. S0F0932 at [redacted]w[redacted]d being seen today for ongoing prenatal care.  She is currently monitored for the following issues for this high-risk pregnancy and has Supervision of high risk pregnancy, antepartum; AMA (advanced maternal age) multigravida 42+; and Pap smear abnormality of cervix with LGSIL on their problem list.  Patient reports no complaints.  Contractions: Not present. Vag. Bleeding: None.  Movement: Absent. Denies leaking of fluid.   The following portions of the patient's history were reviewed and updated as appropriate: allergies, current medications, past family history, past medical history, past social history, past surgical history and problem list.   Objective:   Vitals:   05/22/19 0938  BP: 103/66  Pulse: 74  Weight: 141 lb 14.4 oz (64.4 kg)    Fetal Status: Fetal Heart Rate (bpm): 158   Movement: Absent     General:  Alert, oriented and cooperative. Patient is in no acute distress.  Skin: Skin is warm and dry. No rash noted.   Cardiovascular: Normal heart rate noted  Respiratory: Normal respiratory effort, no problems with respiration noted  Abdomen: Soft, gravid, appropriate for gestational age.  Pain/Pressure: Present     Pelvic: Cervical exam deferred        Extremities: Normal range of motion.  Edema: None  Mental Status: Normal mood and affect. Normal behavior. Normal judgment and thought content.   Assessment and Plan:  Pregnancy: T5T7322 at [redacted]w[redacted]d 1. Supervision of high risk pregnancy, antepartum FHT and FH normal  2. Pap smear abnormality of cervix with LGSIL Colpo today. See separate note  3. Multigravida of advanced maternal age in second trimester ASA 81mg  Daily  Preterm labor symptoms and general obstetric precautions including but not limited to vaginal bleeding, contractions, leaking of fluid and fetal movement were reviewed in detail with the patient. Please refer to After Visit Summary  for other counseling recommendations.   Return in about 4 weeks (around 06/19/2019) for HR OB f/u, In Office.  Future Appointments  Date Time Provider Department Center  06/19/2019  9:15 AM 06/21/2019, MD [facility]  06/19/2019 10:15 AM [facility] NURSE [facility]  06/19/2019 10:15 AM [facility] US2 [facility]    06/21/2019, DO

## 2019-05-22 NOTE — Progress Notes (Signed)
Patient Name: Tracey Hale, female   DOB: [birth year], 44 y.o.  MRN: [id number]  Colposcopy Procedure Note:  [id number] Pregnancy status: [redacted]w[redacted]d Indications: LSIL HPV:  Positive Cervical History:  Previous Abnormal Pap: none  Previous Colposcopy: none  Previous LEEP or Cryo: none  Smoking: Never Smoked Hysterectomy: No   Patient given informed consent, signed copy in the chart, time out was performed.    Exam: Vulva and Vagina grossly normal.  Cervix viewed with speculum and colposcope after application of acetic acid:  Cervix Fully Visualized Squamocolumnar Junction Visibility: Fully visualized, extropion at 12 o'clock Acetowhite lesions: none o'clock  Other Lesions: None Punctation: Not present  Mosaicism: Not present Abnormal vasculature: No   Biopsies: none  ECC: not done secondary to pregnancy  Hemostasis achieved with: n/a   Colposcopy Impression:  Benign

## 2019-06-03 ENCOUNTER — Emergency Department (HOSPITAL_COMMUNITY): Payer: Medicaid Other

## 2019-06-03 ENCOUNTER — Emergency Department (HOSPITAL_COMMUNITY)
Admission: EM | Admit: 2019-06-03 | Discharge: 2019-06-03 | Disposition: A | Discharge status: Home / Self Care | Payer: Medicaid Other | Attending: Emergency Medicine | Admitting: Emergency Medicine

## 2019-06-03 ENCOUNTER — Encounter (HOSPITAL_COMMUNITY): Payer: Self-pay

## 2019-06-03 ENCOUNTER — Other Ambulatory Visit: Payer: Self-pay

## 2019-06-03 DIAGNOSIS — R8271 Bacteriuria: Secondary | ICD-10-CM | POA: Diagnosis not present

## 2019-06-03 DIAGNOSIS — N760 Acute vaginitis: Secondary | ICD-10-CM | POA: Diagnosis not present

## 2019-06-03 DIAGNOSIS — Z3A17 17 weeks gestation of pregnancy: Secondary | ICD-10-CM | POA: Diagnosis not present

## 2019-06-03 DIAGNOSIS — O4690 Antepartum hemorrhage, unspecified, unspecified trimester: Secondary | ICD-10-CM

## 2019-06-03 DIAGNOSIS — O209 Hemorrhage in early pregnancy, unspecified: Secondary | ICD-10-CM | POA: Insufficient documentation

## 2019-06-03 DIAGNOSIS — R102 Pelvic and perineal pain: Secondary | ICD-10-CM | POA: Insufficient documentation

## 2019-06-03 LAB — CBC WITH DIFFERENTIAL/PLATELET
Abs Immature Granulocytes: 0.1 10*3/uL — ABNORMAL HIGH (ref 0.00–0.07)
Basophils Absolute: 0.1 10*3/uL (ref 0.0–0.1)
Basophils Relative: 1 %
Eosinophils Absolute: 0.2 10*3/uL (ref 0.0–0.5)
Eosinophils Relative: 2 %
HCT: 33 % — ABNORMAL LOW (ref 36.0–46.0)
Hemoglobin: 10.7 g/dL — ABNORMAL LOW (ref 12.0–15.0)
Immature Granulocytes: 1 %
Lymphocytes Relative: 17 %
Lymphs Abs: 1.5 10*3/uL (ref 0.7–4.0)
MCH: 28.8 pg (ref 26.0–34.0)
MCHC: 32.4 g/dL (ref 30.0–36.0)
MCV: 88.9 fL (ref 80.0–100.0)
Monocytes Absolute: 0.8 10*3/uL (ref 0.1–1.0)
Monocytes Relative: 8 %
Neutro Abs: 6.4 10*3/uL (ref 1.7–7.7)
Neutrophils Relative %: 71 %
Platelets: 275 10*3/uL (ref 150–400)
RBC: 3.71 MIL/uL — ABNORMAL LOW (ref 3.87–5.11)
RDW: 14.1 % (ref 11.5–15.5)
WBC: 9 10*3/uL (ref 4.0–10.5)
nRBC: 0 % (ref 0.0–0.2)

## 2019-06-03 LAB — URINALYSIS, ROUTINE W REFLEX MICROSCOPIC
Bilirubin Urine: NEGATIVE
Glucose, UA: NEGATIVE mg/dL
Ketones, ur: NEGATIVE mg/dL
Leukocytes,Ua: NEGATIVE
Nitrite: NEGATIVE
Protein, ur: NEGATIVE mg/dL
Specific Gravity, Urine: 1.005 (ref 1.005–1.030)
pH: 8 (ref 5.0–8.0)

## 2019-06-03 LAB — BASIC METABOLIC PANEL
Anion gap: 7 (ref 5–15)
BUN: 10 mg/dL (ref 6–20)
CO2: 25 mmol/L (ref 22–32)
Calcium: 8.8 mg/dL — ABNORMAL LOW (ref 8.9–10.3)
Chloride: 105 mmol/L (ref 98–111)
Creatinine, Ser: 0.71 mg/dL (ref 0.44–1.00)
GFR calc Af Amer: >60 mL/min (ref >60)
GFR calc non Af Amer: >60 mL/min (ref >60)
Glucose, Bld: 95 mg/dL (ref 70–99)
Potassium: 3.9 mmol/L (ref 3.5–5.1)
Sodium: 137 mmol/L (ref 135–145)

## 2019-06-03 LAB — WET PREP, GENITAL
Sperm: NONE SEEN
Trich, Wet Prep: NONE SEEN
Yeast Wet Prep HPF POC: NONE SEEN

## 2019-06-03 LAB — HCG, QUANTITATIVE, PREGNANCY: hCG, Beta Chain, Quant, S: 32563 m[IU]/mL — ABNORMAL HIGH (ref <5)

## 2019-06-03 LAB — RH IG WORKUP (INCLUDES ABO/RH)
ABO/RH(D): B POS
Gestational Age(Wks): 16

## 2019-06-03 LAB — TYPE AND SCREEN
ABO/RH(D): B POS
Antibody Screen: NEGATIVE

## 2019-06-03 LAB — ABO/RH: ABO/RH(D): B POS

## 2019-06-03 MED ORDER — CEPHALEXIN 500 MG PO CAPS
500.0000 mg | ORAL_CAPSULE | Freq: Three times a day (TID) | ORAL | 0 refills | Status: AC
Start: 2019-06-03 — End: 2019-06-08

## 2019-06-03 MED ORDER — METRONIDAZOLE 500 MG PO TABS
500.0000 mg | ORAL_TABLET | Freq: Two times a day (BID) | ORAL | 0 refills | Status: DC
Start: 2019-06-03 — End: 2019-07-04

## 2019-06-03 NOTE — ED Provider Notes (Signed)
[facility]-EMERGENCY DEPT Provider Note   CSN: [id number] Arrival date & time: 06/03/19  1208     History Chief Complaint  Patient presents with  . Vaginal Bleeding    Tracey Hale is a U1L2440 44 y.o. female at 16 weeks 5 days gestation presenting for evaluation of acute onset, persistent vaginal bleeding beginning today.  She reports that she awoke to note the vaginal bleeding.  She states that it is moderate severity, bright red.  She has not saturated a pad yet.  She also endorses lower abdominal cramping that has been present since her pregnancy began but a little worse today.  She has been experiencing nausea and vomiting with her pregnancy intermittently reports that it had significantly improved but worsened last night and she has had 2 episodes of nonbloody nonbilious emesis since yesterday.  Has also had right hip pressure and soreness since yesterday, denies any known trauma.  Pain worsens with sitting for prolonged periods of time and laying flat for prolonged periods of time.  Denies fevers, diarrhea, constipation, chest pain, shortness of breath.  She has not tried anything for her symptoms.  She is followed by Dr. Adrian Blackwater with OB/GYN.  She does report good fetal movement today.   The history is provided by the patient.       Past Medical History:  Diagnosis Date  . Medical history non-contributory     Patient Active Problem List   Diagnosis Date Noted  . Pap smear abnormality of cervix with LGSIL 04/25/2019  . Supervision of high risk pregnancy, antepartum 04/17/2019  . AMA (advanced maternal age) multigravida 40+ 04/17/2019    Past Surgical History:  Procedure Laterality Date  . NO PAST SURGERIES       OB History    Gravida  4   Para  2   Term  2   Preterm      AB  1   Living  2     SAB  1   TAB      Ectopic      Multiple      Live Births  2           Family History  Problem Relation Age of Onset  . Hypertension  Mother   . Hypertension Father     Social History   Tobacco Use  . Smoking status: Never Smoker  . Smokeless tobacco: Never Used  Substance Use Topics  . Alcohol use: Not Currently    Comment: occasionally  . Drug use: Never    Home Medications Prior to Admission medications   Medication Sig Start Date End Date Taking? Authorizing Provider  aspirin EC 81 MG tablet Take 1 tablet (81 mg total) by mouth daily. Take after 12 weeks for prevention of preeclampsia later in pregnancy 04/24/19  Yes Anyanwu, Jethro Bastos, MD  ondansetron (ZOFRAN ODT) 4 MG disintegrating tablet Take 1 tablet (4 mg total) by mouth every 8 (eight) hours as needed for nausea or vomiting. 04/24/19  Yes Anyanwu, Jethro Bastos, MD  Prenatal MV & Min w/FA-DHA (PRENATAL ADULT GUMMY/DHA/FA PO) Take 1 tablet by mouth.   Yes [provider]  cephALEXin (KEFLEX) 500 MG capsule Take 1 capsule (500 mg total) by mouth 3 (three) times daily for 5 days. 06/03/19 06/08/19  Michela Pitcher A, PA-C  metroNIDAZOLE (FLAGYL) 500 MG tablet Take 1 tablet (500 mg total) by mouth 2 (two) times daily. 06/03/19   Callahan Peddie A, PA-C  omeprazole (PRILOSEC) 10  MG capsule Take 10 mg by mouth daily.  03/20/19  [provider]    Allergies    Patient has no known allergies.  Review of Systems   Review of Systems  Constitutional: Negative for chills and fever.  Respiratory: Negative for shortness of breath.   Cardiovascular: Negative for chest pain.  Gastrointestinal: Positive for abdominal pain, nausea and vomiting.  Genitourinary: Positive for vaginal bleeding. Negative for dysuria, frequency and urgency.  Musculoskeletal: Positive for arthralgias.  Neurological: Negative for weakness and numbness.    Physical Exam Updated Vital Signs BP 106/72   Pulse 91   Temp 97.7 F (36.5 C) (Oral)   Resp 18   LMP 02/06/2019   SpO2 100%   Physical Exam Vitals and nursing note reviewed.  Constitutional:      General: She is not in acute  distress.    Appearance: She is well-developed.  HENT:     Head: Normocephalic and atraumatic.  Eyes:     General:        Right eye: No discharge.        Left eye: No discharge.     Conjunctiva/sclera: Conjunctivae normal.  Neck:     Vascular: No JVD.     Trachea: No tracheal deviation.  Cardiovascular:     Rate and Rhythm: Normal rate and regular rhythm.  Pulmonary:     Effort: Pulmonary effort is normal.     Breath sounds: Normal breath sounds.  Abdominal:     General: There is no distension.     Palpations: Abdomen is soft.     Tenderness: There is no abdominal tenderness. There is no right CVA tenderness, left CVA tenderness, guarding or rebound.     Comments: Abdomen gravid  Genitourinary:    Comments: Examination performed in the presence of a chaperone.  No masses or lesions to the external genitalia.  Moderate amount of bloody vaginal discharge noted.  Cervical os is closed.  No cervical motion tenderness or adnexal tenderness. Skin:    General: Skin is warm.     Findings: No erythema.  Neurological:     Mental Status: She is alert.  Psychiatric:        Behavior: Behavior normal.     ED Results / Procedures / Treatments   Labs (all labs ordered are listed, but only abnormal results are displayed) Labs Reviewed  WET PREP, GENITAL - Abnormal; Notable for the following components:      Result Value   Clue Cells Wet Prep HPF POC PRESENT (*)    WBC, Wet Prep HPF POC MANY (*)    All other components within normal limits  CBC WITH DIFFERENTIAL/PLATELET - Abnormal; Notable for the following components:   RBC 3.71 (*)    Hemoglobin 10.7 (*)    HCT 33.0 (*)    Abs Immature Granulocytes 0.10 (*)    All other components within normal limits  BASIC METABOLIC PANEL - Abnormal; Notable for the following components:   Calcium 8.8 (*)    All other components within normal limits  HCG, QUANTITATIVE, PREGNANCY - Abnormal; Notable for the following components:   hCG, Beta  Chain, Quant, S 32,563 (*)    All other components within normal limits  URINALYSIS, ROUTINE W REFLEX MICROSCOPIC - Abnormal; Notable for the following components:   Color, Urine STRAW (*)    Hgb urine dipstick MODERATE (*)    Bacteria, UA RARE (*)    All other components within normal limits  TYPE AND  SCREEN  RH IG WORKUP (INCLUDES ABO/RH)  ABO/RH  GC/CHLAMYDIA PROBE AMP ([employer]) NOT AT [facility]    EKG None  Radiology US OB Limited > 14 wks  Result Date: 06/03/2019 CLINICAL DATA:  Vaginal bleeding, known pregnancy EXAM: LIMITED OBSTETRIC ULTRASOUND AND TRANSVAGINAL OBSTETRIC ULTRASOUND COMPARISON:  05/10/2019 FINDINGS: Number of Fetuses: 1 Heart Rate:  159 bpm Movement: Present Presentation: Variable Placental Location: Anterior fundal Previa: Absent Amniotic Fluid (Subjective): Normal BPD:  3.9cm 17w 6d MATERNAL FINDINGS: Cervix:  Appears closed. Uterus/Adnexae:  Minimal free fluid is noted within the pelvis. IMPRESSION: Single live intrauterine gestation at 17 weeks 6 days. No acute abnormality is noted. This exam is performed on an emergent basis and does not comprehensively evaluate fetal size, dating, or anatomy; follow-up complete OB US should be considered if further fetal assessment is warranted. Electronically Signed   By: Alcide Clever M.D.   On: 06/03/2019 15:15   US OB Transvaginal  Result Date: 06/03/2019 CLINICAL DATA:  Vaginal bleeding, known pregnancy EXAM: LIMITED OBSTETRIC ULTRASOUND AND TRANSVAGINAL OBSTETRIC ULTRASOUND COMPARISON:  05/10/2019 FINDINGS: Number of Fetuses: 1 Heart Rate:  159 bpm Movement: Present Presentation: Variable Placental Location: Anterior fundal Previa: Absent Amniotic Fluid (Subjective): Normal BPD:  3.9cm 17w 6d MATERNAL FINDINGS: Cervix:  Appears closed. Uterus/Adnexae:  Minimal free fluid is noted within the pelvis. IMPRESSION: Single live intrauterine gestation at 17 weeks 6 days. No acute abnormality is noted. This exam is performed on an  emergent basis and does not comprehensively evaluate fetal size, dating, or anatomy; follow-up complete OB US should be considered if further fetal assessment is warranted. Electronically Signed   By: Alcide Clever M.D.   On: 06/03/2019 15:15    Procedures Procedures (including critical care time)  Medications Ordered in ED Medications - No data to display  ED Course  I have reviewed the triage vital signs and the nursing notes.  Pertinent labs & imaging results that were available during my care of the patient were reviewed by me and considered in my medical decision making (see chart for details).    MDM Rules/Calculators/A&P                      Patient presenting for evaluation of vaginal bleeding beginning today.  She is approximately [redacted] weeks pregnant.  She is afebrile, vital signs are stable.  She is nontoxic in appearance.  Examination of the abdomen yields no tenderness, rebound or guarding.  Pelvic examination shows small amount of vaginal bleeding, no cervical motion tenderness or adnexal tenderness and cervical os closed on my assessment.  Patient is also complaining of right hip pains but is neurovascularly intact with normal range of motion.  Doubt fracture or dislocation.  Discussed conservative therapy and management of hip pain with Tylenol gentle stretching exercises and follow-up with PCP or orthopedist.  With regards to the vaginal bleeding a pelvic ultrasound was obtained which shows single live intrauterine gestation at 17 weeks and 6 days with no acute abnormality noted.  Lab work reviewed and interpreted by myself shows no leukocytosis, mild anemia but appears to be at patient's baseline compared to lab work performed 1 month ago, no metabolic derangements or renal insufficiency.  Her quantitative hCG is elevated at 32,563.  Wet prep concerning for BV and UA does show some bacteria so we will treat with Flagyl and Keflex respectively. On reevaluation patient is resting  comfortably in no apparent distress.  She feels comfortable with discharge home at  this time.  We discussed importance of follow-up with OB/GYN on an outpatient basis ideally within 48 hours for recheck of hCG and possible repeat ultrasound.  Discussed strict ED return precautions.  Patient and husband verbalized understanding of and agreement with plan and patient is stable for discharge at this time.  Discussed with Dr. Kathrynn Humble who agrees with assessment and plan at this time.    Final Clinical Impression(s) / ED Diagnoses Final diagnoses:  Vaginal bleeding in pregnancy  BV (bacterial vaginosis)  Asymptomatic bacteriuria    Rx / DC Orders ED Discharge Orders         Ordered    metroNIDAZOLE (FLAGYL) 500 MG tablet  2 times daily     06/03/19 1751    cephALEXin (KEFLEX) 500 MG capsule  3 times daily     06/03/19 1751           Renita Papa, PA-C 06/04/19 [place], Ankit, MD 06/05/19 [phone]

## 2019-06-03 NOTE — Discharge Instructions (Signed)
Please take all of your antibiotics until finished!   Take your antibiotics with food.  Common side effects of antibiotics include nausea, vomiting, abdominal discomfort, and diarrhea. You may help offset some of this with probiotics which you can buy or get in yogurt. Do not eat  or take the probiotics until 2 hours after your antibiotic.    Continue taking all of your home medications as prescribed.  You can take Tylenol as needed for hip pain.  Do not take ibuprofen.  Go to the MAU (maternal assessment unit) at entrance C at [facility] in 48 hours for reevaluation.  They will likely recheck your pregnancy hormone levels and they may want to get a repeat ultrasound.  You can also call your OB/GYN to schedule close follow-up appointment.  Return to the emergency department if any concerning signs or symptoms develop such as fevers, worsening bleeding, weakness, loss of consciousness, persistent vomiting, or severe pain.

## 2019-06-03 NOTE — ED Triage Notes (Addendum)
Pt presents with c/o vaginal bleeding. LMP was 2/1, pt is approx 16 weeks and 5 days pregnant. Pt reports she has not noticed any clots, steady bleeding. Pt reporting lower abdominal pain with some right hip pain as well that just started yesterday.

## 2019-06-06 LAB — GC/CHLAMYDIA PROBE AMP (~~LOC~~) NOT AT ARMC
Chlamydia: NEGATIVE
Comment: NEGATIVE
Comment: NORMAL
Neisseria Gonorrhea: NEGATIVE

## 2019-06-19 ENCOUNTER — Other Ambulatory Visit: Payer: Self-pay

## 2019-06-19 ENCOUNTER — Ambulatory Visit: Payer: Medicaid Other | Admitting: *Deleted

## 2019-06-19 ENCOUNTER — Ambulatory Visit: Payer: Medicaid Other | Attending: Obstetrics and Gynecology

## 2019-06-19 ENCOUNTER — Other Ambulatory Visit: Payer: Self-pay | Admitting: *Deleted

## 2019-06-19 ENCOUNTER — Encounter: Payer: Medicaid Other | Admitting: Obstetrics and Gynecology

## 2019-06-19 VITALS — BP 96/68 | HR 77

## 2019-06-19 DIAGNOSIS — O09522 Supervision of elderly multigravida, second trimester: Secondary | ICD-10-CM | POA: Diagnosis not present

## 2019-06-19 DIAGNOSIS — O09529 Supervision of elderly multigravida, unspecified trimester: Secondary | ICD-10-CM

## 2019-06-19 DIAGNOSIS — Z363 Encounter for antenatal screening for malformations: Secondary | ICD-10-CM | POA: Diagnosis not present

## 2019-06-19 DIAGNOSIS — O0990 Supervision of high risk pregnancy, unspecified, unspecified trimester: Secondary | ICD-10-CM | POA: Diagnosis present

## 2019-06-19 DIAGNOSIS — O4692 Antepartum hemorrhage, unspecified, second trimester: Secondary | ICD-10-CM

## 2019-06-19 DIAGNOSIS — Z3A19 19 weeks gestation of pregnancy: Secondary | ICD-10-CM

## 2019-06-19 NOTE — Progress Notes (Signed)
Pt reports bleeding after intercourse last night. No bleeding or pain at this time.

## 2019-07-04 ENCOUNTER — Other Ambulatory Visit: Payer: Self-pay

## 2019-07-04 ENCOUNTER — Ambulatory Visit (INDEPENDENT_AMBULATORY_CARE_PROVIDER_SITE_OTHER): Payer: Medicaid Other | Admitting: Obstetrics and Gynecology

## 2019-07-04 ENCOUNTER — Encounter: Payer: Self-pay | Admitting: Obstetrics and Gynecology

## 2019-07-04 VITALS — BP 98/65 | HR 91 | Wt 147.3 lb

## 2019-07-04 DIAGNOSIS — O0992 Supervision of high risk pregnancy, unspecified, second trimester: Secondary | ICD-10-CM

## 2019-07-04 DIAGNOSIS — R87612 Low grade squamous intraepithelial lesion on cytologic smear of cervix (LGSIL): Secondary | ICD-10-CM

## 2019-07-04 DIAGNOSIS — O09522 Supervision of elderly multigravida, second trimester: Secondary | ICD-10-CM

## 2019-07-04 DIAGNOSIS — Z3A21 21 weeks gestation of pregnancy: Secondary | ICD-10-CM

## 2019-07-04 DIAGNOSIS — O0990 Supervision of high risk pregnancy, unspecified, unspecified trimester: Secondary | ICD-10-CM

## 2019-07-04 NOTE — Progress Notes (Signed)
   PRENATAL VISIT NOTE  Subjective:  Tracey Hale is a 44 y.o. J4H7026 at [redacted]w[redacted]d being seen today for ongoing prenatal care.  She is currently monitored for the following issues for this high-risk pregnancy and has Supervision of high risk pregnancy, antepartum; AMA (advanced maternal age) multigravida 64+; and Pap smear abnormality of cervix with LGSIL on their problem list.  Patient reports no complaints.  Contractions: Not present. Vag. Bleeding: None.  Movement: Present. Denies leaking of fluid.   The following portions of the patient's history were reviewed and updated as appropriate: allergies, current medications, past family history, past medical history, past social history, past surgical history and problem list.   Objective:   Vitals:   07/04/19 1042  BP: 98/65  Pulse: 91  Weight: 147 lb 4.8 oz (66.8 kg)    Fetal Status: Fetal Heart Rate (bpm): 140 Fundal Height: 21 cm Movement: Present     General:  Alert, oriented and cooperative. Patient is in no acute distress.  Skin: Skin is warm and dry. No rash noted.   Cardiovascular: Normal heart rate noted  Respiratory: Normal respiratory effort, no problems with respiration noted  Abdomen: Soft, gravid, appropriate for gestational age.  Pain/Pressure: Present     Pelvic: Cervical exam deferred        Extremities: Normal range of motion.  Edema: None  Mental Status: Normal mood and affect. Normal behavior. Normal judgment and thought content.   Assessment and Plan:  Pregnancy: V7C5885 at [redacted]w[redacted]d 1. Supervision of high risk pregnancy, antepartum Patient is doing well Anatomy ultrasound reviewed Patient declined AFP  2. Multigravida of advanced maternal age in second trimester Continue ASA  3. Pap smear abnormality of cervix with LGSIL Normal colposcopy from 06/19/19  Preterm labor symptoms and general obstetric precautions including but not limited to vaginal bleeding, contractions, leaking of fluid and fetal movement were  reviewed in detail with the patient. Please refer to After Visit Summary for other counseling recommendations.   Return in about 4 weeks (around 08/01/2019) for in person, ROB, High risk.  Future Appointments  Date Time Provider Department Center  07/18/2019 10:45 AM [facility] NURSE [facility]  07/18/2019 10:45 AM [facility] US4 [facility]    Catalina Antigua, MD

## 2019-07-17 ENCOUNTER — Encounter: Payer: Self-pay | Admitting: General Practice

## 2019-07-18 ENCOUNTER — Other Ambulatory Visit: Payer: Self-pay | Admitting: *Deleted

## 2019-07-18 ENCOUNTER — Other Ambulatory Visit: Payer: Self-pay

## 2019-07-18 ENCOUNTER — Ambulatory Visit: Payer: Medicaid Other | Attending: Obstetrics and Gynecology

## 2019-07-18 ENCOUNTER — Ambulatory Visit: Payer: Medicaid Other | Admitting: *Deleted

## 2019-07-18 VITALS — BP 100/73 | HR 102

## 2019-07-18 DIAGNOSIS — O0990 Supervision of high risk pregnancy, unspecified, unspecified trimester: Secondary | ICD-10-CM

## 2019-07-18 DIAGNOSIS — Z3A23 23 weeks gestation of pregnancy: Secondary | ICD-10-CM | POA: Diagnosis not present

## 2019-07-18 DIAGNOSIS — O09522 Supervision of elderly multigravida, second trimester: Secondary | ICD-10-CM

## 2019-07-18 DIAGNOSIS — Z362 Encounter for other antenatal screening follow-up: Secondary | ICD-10-CM | POA: Diagnosis not present

## 2019-07-18 DIAGNOSIS — O09529 Supervision of elderly multigravida, unspecified trimester: Secondary | ICD-10-CM | POA: Diagnosis not present

## 2019-08-03 ENCOUNTER — Other Ambulatory Visit: Payer: Self-pay

## 2019-08-03 ENCOUNTER — Ambulatory Visit (INDEPENDENT_AMBULATORY_CARE_PROVIDER_SITE_OTHER): Payer: Medicaid Other | Admitting: Obstetrics and Gynecology

## 2019-08-03 DIAGNOSIS — O09522 Supervision of elderly multigravida, second trimester: Secondary | ICD-10-CM

## 2019-08-03 NOTE — Progress Notes (Signed)
Prenatal Visit Note Date: 08/03/2019 Clinic: [facility]  Subjective:  Tracey Hale is a 44 y.o. [phone] at [redacted]w[redacted]d being seen today for ongoing prenatal care.  She is currently monitored for the following issues for this high-risk pregnancy and has Supervision of high risk pregnancy, antepartum; AMA (advanced maternal age) multigravida 79+; and Pap smear abnormality of cervix with LGSIL on their problem list.  Patient reports GERD, flushing with aspirin; she stopped it two days ago.   Contractions: Not present. Vag. Bleeding: None.  Movement: Present. Denies leaking of fluid.   The following portions of the patient's history were reviewed and updated as appropriate: allergies, current medications, past family history, past medical history, past social history, past surgical history and problem list. Problem list updated.  Objective:   Vitals:   08/03/19 1316  BP: (!) 112/63  Pulse: 88  Weight: 157 lb 1.6 oz (71.3 kg)    Fetal Status: Fetal Heart Rate (bpm): 153 Fundal Height: 26 cm Movement: Present     General:  Alert, oriented and cooperative. Patient is in no acute distress.  Skin: Skin is warm and dry. No rash noted.   Cardiovascular: Normal heart rate noted  Respiratory: Normal respiratory effort, no problems with respiration noted  Abdomen: Soft, gravid, appropriate for gestational age. Pain/Pressure: Present     Pelvic:  Cervical exam deferred        Extremities: Normal range of motion.  Edema: None  Mental Status: Normal mood and affect. Normal behavior. Normal judgment and thought content.   Urinalysis:      Assessment and Plan:  Pregnancy: [id number] at [redacted]w[redacted]d  1. Multigravida of advanced maternal age in second trimester Routine care. Okay to stop low dose asa. I also told her that she can try buying the name brand [employer]ayer aspirin to see if that has less reaction to it. 2h GTT nv. Undecided on BC. tums for gerd.   Preterm labor symptoms and  general obstetric precautions including but not limited to vaginal bleeding, contractions, leaking of fluid and fetal movement were reviewed in detail with the patient. Please refer to After Visit Summary for other counseling recommendations.  Return in about 2 weeks (around 08/17/2019) for high risk, 2hr GTT, low risk, in person.   [place] Bing, MD

## 2019-08-16 ENCOUNTER — Other Ambulatory Visit: Payer: Self-pay

## 2019-08-16 ENCOUNTER — Other Ambulatory Visit: Payer: Self-pay | Admitting: *Deleted

## 2019-08-16 ENCOUNTER — Ambulatory Visit: Payer: Medicaid Other | Admitting: *Deleted

## 2019-08-16 ENCOUNTER — Ambulatory Visit: Payer: Medicaid Other | Attending: Obstetrics and Gynecology

## 2019-08-16 VITALS — BP 101/72 | HR 84

## 2019-08-16 DIAGNOSIS — O09522 Supervision of elderly multigravida, second trimester: Secondary | ICD-10-CM

## 2019-08-16 DIAGNOSIS — O09529 Supervision of elderly multigravida, unspecified trimester: Secondary | ICD-10-CM | POA: Diagnosis not present

## 2019-08-16 DIAGNOSIS — Z3A27 27 weeks gestation of pregnancy: Secondary | ICD-10-CM | POA: Diagnosis not present

## 2019-08-16 DIAGNOSIS — Z362 Encounter for other antenatal screening follow-up: Secondary | ICD-10-CM | POA: Diagnosis not present

## 2019-08-17 ENCOUNTER — Other Ambulatory Visit: Payer: Self-pay | Admitting: *Deleted

## 2019-08-17 DIAGNOSIS — O0990 Supervision of high risk pregnancy, unspecified, unspecified trimester: Secondary | ICD-10-CM

## 2019-08-21 ENCOUNTER — Encounter: Payer: Self-pay | Admitting: Family Medicine

## 2019-08-21 ENCOUNTER — Ambulatory Visit (INDEPENDENT_AMBULATORY_CARE_PROVIDER_SITE_OTHER): Payer: Medicaid Other | Admitting: Family Medicine

## 2019-08-21 ENCOUNTER — Other Ambulatory Visit: Payer: Self-pay

## 2019-08-21 ENCOUNTER — Other Ambulatory Visit: Payer: Medicaid Other

## 2019-08-21 VITALS — BP 99/72 | HR 88 | Wt 158.0 lb

## 2019-08-21 DIAGNOSIS — Z23 Encounter for immunization: Secondary | ICD-10-CM

## 2019-08-21 DIAGNOSIS — O09523 Supervision of elderly multigravida, third trimester: Secondary | ICD-10-CM

## 2019-08-21 DIAGNOSIS — Z3A28 28 weeks gestation of pregnancy: Secondary | ICD-10-CM

## 2019-08-21 DIAGNOSIS — O0990 Supervision of high risk pregnancy, unspecified, unspecified trimester: Secondary | ICD-10-CM

## 2019-08-21 NOTE — Progress Notes (Signed)
   PRENATAL VISIT NOTE  Subjective:  Tracey Hale is a 44 y.o. [id number] at [redacted]w[redacted]d being seen today for ongoing prenatal care.  She is currently monitored for the following issues for this high-risk pregnancy and has Supervision of high risk pregnancy, antepartum; AMA (advanced maternal age) multigravida 37+; and Pap smear abnormality of cervix with LGSIL on their problem list.  Patient reports no complaints.  Contractions: Not present. Vag. Bleeding: None.  Movement: Present. Denies leaking of fluid.   The following portions of the patient's history were reviewed and updated as appropriate: allergies, current medications, past family history, past medical history, past social history, past surgical history and problem list.   Objective:   Vitals:   08/21/19 1033  BP: 99/72  Pulse: 88  Weight: 158 lb (71.7 kg)    Fetal Status: Fetal Heart Rate (bpm): 143 Fundal Height: 29 cm Movement: Present     General:  Alert, oriented and cooperative. Patient is in no acute distress.  Skin: Skin is warm and dry. No rash noted.   Cardiovascular: Normal heart rate noted  Respiratory: Normal respiratory effort, no problems with respiration noted  Abdomen: Soft, gravid, appropriate for gestational age.  Pain/Pressure: Present     Pelvic: Cervical exam deferred        Extremities: Normal range of motion.  Edema: None  Mental Status: Normal mood and affect. Normal behavior. Normal judgment and thought content.   Assessment and Plan:  Pregnancy: [id number] at [redacted]w[redacted]d 1. Supervision of high risk pregnancy, antepartum 28 wk labs and TDaP today - Tdap vaccine greater than or equal to 7yo IM  2. Multigravida of advanced maternal age in third trimester Normal NIPT, serial u/s for growth scheduled. Will need antenatal testing at 36 wks  Preterm labor symptoms and general obstetric precautions including but not limited to vaginal bleeding, contractions, leaking of fluid and fetal movement were reviewed in detail  with the patient. Please refer to After Visit Summary for other counseling recommendations.   Return in 2 weeks (on 09/04/2019) for [facility], in person.  Future Appointments  Date Time Provider Department Center  09/27/2019 10:30 AM [facility] NURSE [facility]  09/27/2019 10:45 AM [facility]    Reva Bores, MD

## 2019-08-21 NOTE — Patient Instructions (Signed)
 Contraception Choices Contraception, also called birth control, refers to methods or devices that prevent pregnancy. Hormonal methods Contraceptive implant  A contraceptive implant is a thin, plastic tube that contains a hormone. It is inserted into the upper part of the arm. It can remain in place for up to 3 years. Progestin-only injections Progestin-only injections are injections of progestin, a synthetic form of the hormone progesterone. They are given every 3 months by a health care provider. Birth control pills  Birth control pills are pills that contain hormones that prevent pregnancy. They must be taken once a day, preferably at the same time each day. Birth control patch  The birth control patch contains hormones that prevent pregnancy. It is placed on the skin and must be changed once a week for three weeks and removed on the fourth week. A prescription is needed to use this method of contraception. Vaginal ring  A vaginal ring contains hormones that prevent pregnancy. It is placed in the vagina for three weeks and removed on the fourth week. After that, the process is repeated with a new ring. A prescription is needed to use this method of contraception. Emergency contraceptive Emergency contraceptives prevent pregnancy after unprotected sex. They come in pill form and can be taken up to 5 days after sex. They work best the sooner they are taken after having sex. Most emergency contraceptives are available without a prescription. This method should not be used as your only form of birth control. Barrier methods Female condom  A female condom is a thin sheath that is worn over the penis during sex. Condoms keep sperm from going inside a woman's body. They can be used with a spermicide to increase their effectiveness. They should be disposed after a single use. Female condom  A female condom is a soft, loose-fitting sheath that is put into the vagina before sex. The condom keeps  sperm from going inside a woman's body. They should be disposed after a single use. Diaphragm  A diaphragm is a soft, dome-shaped barrier. It is inserted into the vagina before sex, along with a spermicide. The diaphragm blocks sperm from entering the uterus, and the spermicide kills sperm. A diaphragm should be left in the vagina for 6-8 hours after sex and removed within 24 hours. A diaphragm is prescribed and fitted by a health care provider. A diaphragm should be replaced every 1-2 years, after giving birth, after gaining more than 15 lb (6.8 kg), and after pelvic surgery. Cervical cap  A cervical cap is a round, soft latex or plastic cup that fits over the cervix. It is inserted into the vagina before sex, along with spermicide. It blocks sperm from entering the uterus. The cap should be left in place for 6-8 hours after sex and removed within 48 hours. A cervical cap must be prescribed and fitted by a health care provider. It should be replaced every 2 years. Sponge  A sponge is a soft, circular piece of polyurethane foam with spermicide on it. The sponge helps block sperm from entering the uterus, and the spermicide kills sperm. To use it, you make it wet and then insert it into the vagina. It should be inserted before sex, left in for at least 6 hours after sex, and removed and thrown away within 30 hours. Spermicides Spermicides are chemicals that kill or block sperm from entering the cervix and uterus. They can come as a cream, jelly, suppository, foam, or tablet. A spermicide should be inserted into   the vagina with an applicator at least 10-15 minutes before sex to allow time for it to work. The process must be repeated every time you have sex. Spermicides do not require a prescription. Intrauterine contraception Intrauterine device (IUD) An IUD is a T-shaped device that is put in a woman's uterus. There are two types:  Hormone IUD.This type contains progestin, a synthetic form of the  hormone progesterone. This type can stay in place for 3-5 years.  Copper IUD.This type is wrapped in copper wire. It can stay in place for 10 years.  Permanent methods of contraception Female tubal ligation In this method, a woman's fallopian tubes are sealed, tied, or blocked during surgery to prevent eggs from traveling to the uterus. Hysteroscopic sterilization In this method, a small, flexible insert is placed into each fallopian tube. The inserts cause scar tissue to form in the fallopian tubes and block them, so sperm cannot reach an egg. The procedure takes about 3 months to be effective. Another form of birth control must be used during those 3 months. Female sterilization This is a procedure to tie off the tubes that carry sperm (vasectomy). After the procedure, the man can still ejaculate fluid (semen). Natural planning methods Natural family planning In this method, a couple does not have sex on days when the woman could become pregnant. Calendar method This means keeping track of the length of each menstrual cycle, identifying the days when pregnancy can happen, and not having sex on those days. Ovulation method In this method, a couple avoids sex during ovulation. Symptothermal method This method involves not having sex during ovulation. The woman typically checks for ovulation by watching changes in her temperature and in the consistency of cervical mucus. Post-ovulation method In this method, a couple waits to have sex until after ovulation. Summary  Contraception, also called birth control, means methods or devices that prevent pregnancy.  Hormonal methods of contraception include implants, injections, pills, patches, vaginal rings, and emergency contraceptives.  Barrier methods of contraception can include female condoms, female condoms, diaphragms, cervical caps, sponges, and spermicides.  There are two types of IUDs (intrauterine devices). An IUD can be put in a woman's  uterus to prevent pregnancy for 3-5 years.  Permanent sterilization can be done through a procedure for males, females, or both.  Natural family planning methods involve not having sex on days when the woman could become pregnant. This information is not intended to replace advice given to you by your health care provider. Make sure you discuss any questions you have with your health care provider. Document Revised: 12/24/2016 Document Reviewed: 01/25/2016 Elsevier Patient Education  2020 [employer]   Breastfeeding  Choosing to breastfeed is one of the best decisions you can make for yourself and your baby. A change in hormones during pregnancy causes your breasts to make breast milk in your milk-producing glands. Hormones prevent breast milk from being released before your baby is born. They also prompt milk flow after birth. Once breastfeeding has begun, thoughts of your baby, as well as his or her sucking or crying, can stimulate the release of milk from your milk-producing glands. Benefits of breastfeeding Research shows that breastfeeding offers many health benefits for infants and mothers. It also offers a cost-free and convenient way to feed your baby. For your baby  Your first milk (colostrum) helps your baby's digestive system to function better.  Special cells in your milk (antibodies) help your baby to fight off infections.  Breastfed babies are   less likely to develop asthma, allergies, obesity, or type 2 diabetes. They are also at lower risk for sudden infant death syndrome (SIDS).  Nutrients in breast milk are better able to meet your baby's needs compared to infant formula.  Breast milk improves your baby's brain development. For you  Breastfeeding helps to create a very special bond between you and your baby.  Breastfeeding is convenient. Breast milk costs nothing and is always available at the correct temperature.  Breastfeeding helps to burn calories. It helps you  to lose the weight that you gained during pregnancy.  Breastfeeding makes your uterus return faster to its size before pregnancy. It also slows bleeding (lochia) after you give birth.  Breastfeeding helps to lower your risk of developing type 2 diabetes, osteoporosis, rheumatoid arthritis, cardiovascular disease, and breast, ovarian, uterine, and endometrial cancer later in life. Breastfeeding basics Starting breastfeeding  Find a comfortable place to sit or lie down, with your neck and back well-supported.  Place a pillow or a rolled-up blanket under your baby to bring him or her to the level of your breast (if you are seated). Nursing pillows are specially designed to help support your arms and your baby while you breastfeed.  Make sure that your baby's tummy (abdomen) is facing your abdomen.  Gently massage your breast. With your fingertips, massage from the outer edges of your breast inward toward the nipple. This encourages milk flow. If your milk flows slowly, you may need to continue this action during the feeding.  Support your breast with 4 fingers underneath and your thumb above your nipple (make the letter "C" with your hand). Make sure your fingers are well away from your nipple and your baby's mouth.  Stroke your baby's lips gently with your finger or nipple.  When your baby's mouth is open wide enough, quickly bring your baby to your breast, placing your entire nipple and as much of the areola as possible into your baby's mouth. The areola is the colored area around your nipple. ? More areola should be visible above your baby's upper lip than below the lower lip. ? Your baby's lips should be opened and extended outward (flanged) to ensure an adequate, comfortable latch. ? Your baby's tongue should be between his or her lower gum and your breast.  Make sure that your baby's mouth is correctly positioned around your nipple (latched). Your baby's lips should create a seal on your  breast and be turned out (everted).  It is common for your baby to suck about 2-3 minutes in order to start the flow of breast milk. Latching Teaching your baby how to latch onto your breast properly is very important. An improper latch can cause nipple pain, decreased milk supply, and poor weight gain in your baby. Also, if your baby is not latched onto your nipple properly, he or she may swallow some air during feeding. This can make your baby fussy. Burping your baby when you switch breasts during the feeding can help to get rid of the air. However, teaching your baby to latch on properly is still the best way to prevent fussiness from swallowing air while breastfeeding. Signs that your baby has successfully latched onto your nipple  Silent tugging or silent sucking, without causing you pain. Infant's lips should be extended outward (flanged).  Swallowing heard between every 3-4 sucks once your milk has started to flow (after your let-down milk reflex occurs).  Muscle movement above and in front of his or her   ears while sucking. Signs that your baby has not successfully latched onto your nipple  Sucking sounds or smacking sounds from your baby while breastfeeding.  Nipple pain. If you think your baby has not latched on correctly, slip your finger into the corner of your baby's mouth to break the suction and place it between your baby's gums. Attempt to start breastfeeding again. Signs of successful breastfeeding Signs from your baby  Your baby will gradually decrease the number of sucks or will completely stop sucking.  Your baby will fall asleep.  Your baby's body will relax.  Your baby will retain a small amount of milk in his or her mouth.  Your baby will let go of your breast by himself or herself. Signs from you  Breasts that have increased in firmness, weight, and size 1-3 hours after feeding.  Breasts that are softer immediately after breastfeeding.  Increased milk  volume, as well as a change in milk consistency and color by the fifth day of breastfeeding.  Nipples that are not sore, cracked, or bleeding. Signs that your baby is getting enough milk  Wetting at least 1-2 diapers during the first 24 hours after birth.  Wetting at least 5-6 diapers every 24 hours for the first week after birth. The urine should be clear or pale yellow by the age of 5 days.  Wetting 6-8 diapers every 24 hours as your baby continues to grow and develop.  At least 3 stools in a 24-hour period by the age of 5 days. The stool should be soft and yellow.  At least 3 stools in a 24-hour period by the age of 7 days. The stool should be seedy and yellow.  No loss of weight greater than 10% of birth weight during the first 3 days of life.  Average weight gain of 4-7 oz (113-198 g) per week after the age of 4 days.  Consistent daily weight gain by the age of 5 days, without weight loss after the age of 2 weeks. After a feeding, your baby may spit up a small amount of milk. This is normal. Breastfeeding frequency and duration Frequent feeding will help you make more milk and can prevent sore nipples and extremely full breasts (breast engorgement). Breastfeed when you feel the need to reduce the fullness of your breasts or when your baby shows signs of hunger. This is called "breastfeeding on demand." Signs that your baby is hungry include:  Increased alertness, activity, or restlessness.  Movement of the head from side to side.  Opening of the mouth when the corner of the mouth or cheek is stroked (rooting).  Increased sucking sounds, smacking lips, cooing, sighing, or squeaking.  Hand-to-mouth movements and sucking on fingers or hands.  Fussing or crying. Avoid introducing a pacifier to your baby in the first 4-6 weeks after your baby is born. After this time, you may choose to use a pacifier. Research has shown that pacifier use during the first year of a baby's life  decreases the risk of sudden infant death syndrome (SIDS). Allow your baby to feed on each breast as long as he or she wants. When your baby unlatches or falls asleep while feeding from the first breast, offer the second breast. Because newborns are often sleepy in the first few weeks of life, you may need to awaken your baby to get him or her to feed. Breastfeeding times will vary from baby to baby. However, the following rules can serve as a guide to   help you make sure that your baby is properly fed:  Newborns (babies 4 weeks of age or younger) may breastfeed every 1-3 hours.  Newborns should not go without breastfeeding for longer than 3 hours during the day or 5 hours during the night.  You should breastfeed your baby a minimum of 8 times in a 24-hour period. Breast milk pumping     Pumping and storing breast milk allows you to make sure that your baby is exclusively fed your breast milk, even at times when you are unable to breastfeed. This is especially important if you go back to work while you are still breastfeeding, or if you are not able to be present during feedings. Your lactation consultant can help you find a method of pumping that works best for you and give you guidelines about how long it is safe to store breast milk. Caring for your breasts while you breastfeed Nipples can become dry, cracked, and sore while breastfeeding. The following recommendations can help keep your breasts moisturized and healthy:  Avoid using soap on your nipples.  Wear a supportive bra designed especially for nursing. Avoid wearing underwire-style bras or extremely tight bras (sports bras).  Air-dry your nipples for 3-4 minutes after each feeding.  Use only cotton bra pads to absorb leaked breast milk. Leaking of breast milk between feedings is normal.  Use lanolin on your nipples after breastfeeding. Lanolin helps to maintain your skin's normal moisture barrier. Pure lanolin is not harmful (not  toxic) to your baby. You may also hand express a few drops of breast milk and gently massage that milk into your nipples and allow the milk to air-dry. In the first few weeks after giving birth, some women experience breast engorgement. Engorgement can make your breasts feel heavy, warm, and tender to the touch. Engorgement peaks within 3-5 days after you give birth. The following recommendations can help to ease engorgement:  Completely empty your breasts while breastfeeding or pumping. You may want to start by applying warm, moist heat (in the shower or with warm, water-soaked hand towels) just before feeding or pumping. This increases circulation and helps the milk flow. If your baby does not completely empty your breasts while breastfeeding, pump any extra milk after he or she is finished.  Apply ice packs to your breasts immediately after breastfeeding or pumping, unless this is too uncomfortable for you. To do this: ? Put ice in a plastic bag. ? Place a towel between your skin and the bag. ? Leave the ice on for 20 minutes, 2-3 times a day.  Make sure that your baby is latched on and positioned properly while breastfeeding. If engorgement persists after 48 hours of following these recommendations, contact your health care provider or a lactation consultant. Overall health care recommendations while breastfeeding  Eat 3 healthy meals and 3 snacks every day. Well-nourished mothers who are breastfeeding need an additional 450-500 calories a day. You can meet this requirement by increasing the amount of a balanced diet that you eat.  Drink enough water to keep your urine pale yellow or clear.  Rest often, relax, and continue to take your prenatal vitamins to prevent fatigue, stress, and low vitamin and mineral levels in your body (nutrient deficiencies).  Do not use any products that contain nicotine or tobacco, such as cigarettes and e-cigarettes. Your baby may be harmed by chemicals from  cigarettes that pass into breast milk and exposure to secondhand smoke. If you need help quitting, ask your   health care provider.  Avoid alcohol.  Do not use illegal drugs or marijuana.  Talk with your health care provider before taking any medicines. These include over-the-counter and prescription medicines as well as vitamins and herbal supplements. Some medicines that may be harmful to your baby can pass through breast milk.  It is possible to become pregnant while breastfeeding. If birth control is desired, ask your health care provider about options that will be safe while breastfeeding your baby. Where to find more information: [employer]: www.llli.org Contact a health care provider if:  You feel like you want to stop breastfeeding or have become frustrated with breastfeeding.  Your nipples are cracked or bleeding.  Your breasts are red, tender, or warm.  You have: ? Painful breasts or nipples. ? A swollen area on either breast. ? A fever or chills. ? Nausea or vomiting. ? Drainage other than breast milk from your nipples.  Your breasts do not become full before feedings by the fifth day after you give birth.  You feel sad and depressed.  Your baby is: ? Too sleepy to eat well. ? Having trouble sleeping. ? More than 1 week old and wetting fewer than 6 diapers in a 24-hour period. ? Not gaining weight by 5 days of age.  Your baby has fewer than 3 stools in a 24-hour period.  Your baby's skin or the white parts of his or her eyes become yellow. Get help right away if:  Your baby is overly tired (lethargic) and does not want to wake up and feed.  Your baby develops an unexplained fever. Summary  Breastfeeding offers many health benefits for infant and mothers.  Try to breastfeed your infant when he or she shows early signs of hunger.  Gently tickle or stroke your baby's lips with your finger or nipple to allow the baby to open his or her mouth.  Bring the baby to your breast. Make sure that much of the areola is in your baby's mouth. Offer one side and burp the baby before you offer the other side.  Talk with your health care provider or lactation consultant if you have questions or you face problems as you breastfeed. This information is not intended to replace advice given to you by your health care provider. Make sure you discuss any questions you have with your health care provider. Document Revised: 03/18/2017 Document Reviewed: 01/24/2016 Elsevier Patient Education  2020 [employer] Inc.  

## 2019-08-22 LAB — CBC
Hematocrit: 31.1 % — ABNORMAL LOW (ref 34.0–46.6)
Hemoglobin: 10.2 g/dL — ABNORMAL LOW (ref 11.1–15.9)
MCH: 28.1 pg (ref 26.6–33.0)
MCHC: 32.8 g/dL (ref 31.5–35.7)
MCV: 86 fL (ref 79–97)
Platelets: 308 10*3/uL (ref 150–450)
RBC: 3.63 x10E6/uL — ABNORMAL LOW (ref 3.77–5.28)
RDW: 13.2 % (ref 11.7–15.4)
WBC: 10.4 10*3/uL (ref 3.4–10.8)

## 2019-08-22 LAB — GLUCOSE TOLERANCE, 2 HOURS W/ 1HR
Glucose, 1 hour: 157 mg/dL (ref 65–179)
Glucose, 2 hour: 134 mg/dL (ref 65–152)
Glucose, Fasting: 84 mg/dL (ref 65–91)

## 2019-08-22 LAB — RPR: RPR Ser Ql: NONREACTIVE

## 2019-08-22 LAB — HIV ANTIBODY (ROUTINE TESTING W REFLEX): HIV Screen 4th Generation wRfx: NONREACTIVE

## 2019-09-08 ENCOUNTER — Encounter: Payer: Self-pay | Admitting: Family Medicine

## 2019-09-08 ENCOUNTER — Other Ambulatory Visit: Payer: Self-pay

## 2019-09-08 ENCOUNTER — Ambulatory Visit (INDEPENDENT_AMBULATORY_CARE_PROVIDER_SITE_OTHER): Payer: Medicaid Other | Admitting: Family Medicine

## 2019-09-08 VITALS — BP 96/78 | HR 85 | Wt 162.2 lb

## 2019-09-08 DIAGNOSIS — O09523 Supervision of elderly multigravida, third trimester: Secondary | ICD-10-CM

## 2019-09-08 DIAGNOSIS — O0990 Supervision of high risk pregnancy, unspecified, unspecified trimester: Secondary | ICD-10-CM

## 2019-09-08 DIAGNOSIS — R87612 Low grade squamous intraepithelial lesion on cytologic smear of cervix (LGSIL): Secondary | ICD-10-CM

## 2019-09-08 DIAGNOSIS — D508 Other iron deficiency anemias: Secondary | ICD-10-CM

## 2019-09-08 DIAGNOSIS — Z3A30 30 weeks gestation of pregnancy: Secondary | ICD-10-CM

## 2019-09-08 NOTE — Progress Notes (Signed)
   PRENATAL VISIT NOTE  Subjective:  Tracey Hale is a 44 y.o. [id number] at [redacted]w[redacted]d being seen today for ongoing prenatal care.  She is currently monitored for the following issues for this high-risk pregnancy and has Supervision of high risk pregnancy, antepartum; AMA (advanced maternal age) multigravida 73+; and Pap smear abnormality of cervix with LGSIL on their problem list.  Patient reports no complaints.  Contractions: Not present. Vag. Bleeding: None.  Movement: Present. Denies leaking of fluid.   The following portions of the patient's history were reviewed and updated as appropriate: allergies, current medications, past family history, past medical history, past social history, past surgical history and problem list.   Objective:   Vitals:   09/08/19 0932  BP: 96/78  Pulse: 85  Weight: 162 lb 3.2 oz (73.6 kg)    Fetal Status: Fetal Heart Rate (bpm): 152   Movement: Present     General:  Alert, oriented and cooperative. Patient is in no acute distress.  Skin: Skin is warm and dry. No rash noted.   Cardiovascular: Normal heart rate noted  Respiratory: Normal respiratory effort, no problems with respiration noted  Abdomen: Soft, gravid, appropriate for gestational age.  Pain/Pressure: Present     Pelvic: Cervical exam deferred        Extremities: Normal range of motion.  Edema: None  Mental Status: Normal mood and affect. Normal behavior. Normal judgment and thought content.   Assessment and Plan:  Pregnancy: [id number] at [redacted]w[redacted]d 1. Supervision of high risk pregnancy, antepartum 2. [redacted] weeks gestation of pregnancy FHT normal  3. Multigravida of advanced maternal age in third trimester On ASA 81mg  Serial growth   4. Pap smear abnormality of cervix with LGSIL Postpartum colpo  5. Other iron deficiency anemia   Preterm labor symptoms and general obstetric precautions including but not limited to vaginal bleeding, contractions, leaking of fluid and fetal movement were reviewed  in detail with the patient. Please refer to After Visit Summary for other counseling recommendations.   No follow-ups on file.  Future Appointments  Date Time Provider Department Center  09/27/2019 10:30 AM [facility] NURSE [facility]  09/27/2019 10:45 AM [facility]    09/29/2019, DO

## 2019-09-14 ENCOUNTER — Encounter: Payer: Self-pay | Admitting: General Practice

## 2019-09-21 ENCOUNTER — Ambulatory Visit (INDEPENDENT_AMBULATORY_CARE_PROVIDER_SITE_OTHER): Payer: Medicaid Other | Admitting: Family Medicine

## 2019-09-21 ENCOUNTER — Other Ambulatory Visit: Payer: Self-pay

## 2019-09-21 VITALS — BP 112/73 | HR 97 | Wt 166.0 lb

## 2019-09-21 DIAGNOSIS — L299 Pruritus, unspecified: Secondary | ICD-10-CM

## 2019-09-21 DIAGNOSIS — O09523 Supervision of elderly multigravida, third trimester: Secondary | ICD-10-CM

## 2019-09-21 DIAGNOSIS — R87612 Low grade squamous intraepithelial lesion on cytologic smear of cervix (LGSIL): Secondary | ICD-10-CM

## 2019-09-21 DIAGNOSIS — O0990 Supervision of high risk pregnancy, unspecified, unspecified trimester: Secondary | ICD-10-CM

## 2019-09-21 NOTE — Progress Notes (Signed)
   PRENATAL VISIT NOTE  Subjective:  Tracey Hale is a 44 y.o. [id number] at [redacted]w[redacted]d being seen today for ongoing prenatal care.  She is currently monitored for the following issues for this high-risk pregnancy and has Supervision of high risk pregnancy, antepartum; AMA (advanced maternal age) multigravida 32+; and Pap smear abnormality of cervix with LGSIL on their problem list.  Patient reports no complaints.  Contractions: Not present. Vag. Bleeding: None.  Movement: Present. Denies leaking of fluid.   The following portions of the patient's history were reviewed and updated as appropriate: allergies, current medications, past family history, past medical history, past social history, past surgical history and problem list.   Objective:   Vitals:   09/21/19 1057  BP: 112/73  Pulse: 97  Weight: 166 lb (75.3 kg)    Fetal Status: Fetal Heart Rate (bpm): 156 Fundal Height: 35 cm Movement: Present  Presentation: Vertex  General:  Alert, oriented and cooperative. Patient is in no acute distress.  Skin: Skin is warm and dry. No rash noted.   Cardiovascular: Normal heart rate noted  Respiratory: Normal respiratory effort, no problems with respiration noted  Abdomen: Soft, gravid, appropriate for gestational age.  Pain/Pressure: Present     Pelvic: Cervical exam deferred        Extremities: Normal range of motion.  Edema: None  Mental Status: Normal mood and affect. Normal behavior. Normal judgment and thought content.   Assessment and Plan:  Pregnancy: [id number] at [redacted]w[redacted]d 1. Supervision of high risk pregnancy, antepartum FHT and FH normal  2. Multigravida of advanced maternal age in third trimester Not on ASA $Remo'81mg'BBUrP$  because it keeps her awake  3. Pap smear abnormality of cervix with LGSIL PP colpo  4. Itching Having itching all over and on palms. - Bile acids, total - Comp Met (CMET)  Preterm labor symptoms and general obstetric precautions including but not limited to vaginal bleeding,  contractions, leaking of fluid and fetal movement were reviewed in detail with the patient. Please refer to After Visit Summary for other counseling recommendations.   No follow-ups on file.  Future Appointments  Date Time Provider [place]  09/27/2019 10:30 AM [facility] NURSE [facility]  09/27/2019 10:45 AM [facility]4 [facility]  10/05/2019  9:55 AM Griffin Basil, MD [facility] [place], DO

## 2019-09-23 LAB — COMPREHENSIVE METABOLIC PANEL
ALT: 9 IU/L (ref 0–32)
AST: 16 IU/L (ref 0–40)
Albumin/Globulin Ratio: 1.1 — ABNORMAL LOW (ref 1.2–2.2)
Albumin: 3.5 g/dL — ABNORMAL LOW (ref 3.8–4.8)
Alkaline Phosphatase: 100 IU/L (ref 44–121)
BUN/Creatinine Ratio: 9 (ref 9–23)
BUN: 6 mg/dL (ref 6–24)
Bilirubin Total: <0.2 mg/dL (ref 0.0–1.2)
CO2: 19 mmol/L — ABNORMAL LOW (ref 20–29)
Calcium: 8.9 mg/dL (ref 8.7–10.2)
Chloride: 103 mmol/L (ref 96–106)
Creatinine, Ser: 0.67 mg/dL (ref 0.57–1.00)
GFR calc Af Amer: 125 mL/min/{1.73_m2} (ref >59)
GFR calc non Af Amer: 109 mL/min/{1.73_m2} (ref >59)
Globulin, Total: 3.2 g/dL (ref 1.5–4.5)
Glucose: 111 mg/dL — ABNORMAL HIGH (ref 65–99)
Potassium: 3.9 mmol/L (ref 3.5–5.2)
Sodium: 136 mmol/L (ref 134–144)
Total Protein: 6.7 g/dL (ref 6.0–8.5)

## 2019-09-23 LAB — BILE ACIDS, TOTAL: Bile Acids Total: 2.5 umol/L (ref 0.0–10.0)

## 2019-09-27 ENCOUNTER — Encounter: Payer: Self-pay | Admitting: *Deleted

## 2019-09-27 ENCOUNTER — Other Ambulatory Visit: Payer: Self-pay

## 2019-09-27 ENCOUNTER — Ambulatory Visit: Payer: Medicaid Other | Admitting: *Deleted

## 2019-09-27 ENCOUNTER — Ambulatory Visit: Payer: Medicaid Other | Attending: Obstetrics and Gynecology

## 2019-09-27 ENCOUNTER — Other Ambulatory Visit: Payer: Self-pay | Admitting: *Deleted

## 2019-09-27 VITALS — BP 106/64 | HR 75

## 2019-09-27 DIAGNOSIS — O322XX0 Maternal care for transverse and oblique lie, not applicable or unspecified: Secondary | ICD-10-CM

## 2019-09-27 DIAGNOSIS — O09523 Supervision of elderly multigravida, third trimester: Secondary | ICD-10-CM

## 2019-09-27 DIAGNOSIS — O09529 Supervision of elderly multigravida, unspecified trimester: Secondary | ICD-10-CM | POA: Insufficient documentation

## 2019-09-27 DIAGNOSIS — Z3A33 33 weeks gestation of pregnancy: Secondary | ICD-10-CM | POA: Diagnosis not present

## 2019-09-27 DIAGNOSIS — Z362 Encounter for other antenatal screening follow-up: Secondary | ICD-10-CM | POA: Diagnosis not present

## 2019-10-03 ENCOUNTER — Encounter: Payer: Self-pay | Admitting: *Deleted

## 2019-10-03 ENCOUNTER — Ambulatory Visit: Payer: Medicaid Other | Admitting: *Deleted

## 2019-10-03 ENCOUNTER — Other Ambulatory Visit: Payer: Self-pay

## 2019-10-03 ENCOUNTER — Ambulatory Visit: Payer: Medicaid Other | Attending: Obstetrics and Gynecology

## 2019-10-03 VITALS — BP 107/72 | HR 94

## 2019-10-03 DIAGNOSIS — Z3A34 34 weeks gestation of pregnancy: Secondary | ICD-10-CM

## 2019-10-03 DIAGNOSIS — O09523 Supervision of elderly multigravida, third trimester: Secondary | ICD-10-CM

## 2019-10-03 DIAGNOSIS — O321XX0 Maternal care for breech presentation, not applicable or unspecified: Secondary | ICD-10-CM | POA: Diagnosis not present

## 2019-10-05 ENCOUNTER — Other Ambulatory Visit: Payer: Self-pay

## 2019-10-05 ENCOUNTER — Ambulatory Visit (INDEPENDENT_AMBULATORY_CARE_PROVIDER_SITE_OTHER): Payer: Medicaid Other | Admitting: Obstetrics and Gynecology

## 2019-10-05 VITALS — BP 102/83 | HR 85 | Wt 170.0 lb

## 2019-10-05 DIAGNOSIS — O09523 Supervision of elderly multigravida, third trimester: Secondary | ICD-10-CM

## 2019-10-05 DIAGNOSIS — K219 Gastro-esophageal reflux disease without esophagitis: Secondary | ICD-10-CM

## 2019-10-05 DIAGNOSIS — R87612 Low grade squamous intraepithelial lesion on cytologic smear of cervix (LGSIL): Secondary | ICD-10-CM

## 2019-10-05 DIAGNOSIS — O0990 Supervision of high risk pregnancy, unspecified, unspecified trimester: Secondary | ICD-10-CM

## 2019-10-05 MED ORDER — OMEPRAZOLE 40 MG PO CPDR: 40.0000 mg | DELAYED_RELEASE_CAPSULE | Freq: Every day | ORAL | 1 refills | Status: DC

## 2019-10-05 NOTE — Progress Notes (Signed)
Needs refill of heartburn medication need new RX

## 2019-10-05 NOTE — Patient Instructions (Signed)

## 2019-10-05 NOTE — Progress Notes (Signed)
   PRENATAL VISIT NOTE  Subjective:  Tracey Hale is a 44 y.o. [id number] at [redacted]w[redacted]d being seen today for ongoing prenatal care.  She is currently monitored for the following issues for this high-risk pregnancy and has Supervision of high risk pregnancy, antepartum; AMA (advanced maternal age) multigravida 68+; and Pap smear abnormality of cervix with LGSIL on their problem list.  Patient doing well with no acute concerns today. She reports no complaints.  Contractions: Not present. Vag. Bleeding: None.  Movement: Present. Denies leaking of fluid.    Pt noted mild GERD and requested refill rx  The following portions of the patient's history were reviewed and updated as appropriate: allergies, current medications, past family history, past medical history, past social history, past surgical history and problem list. Problem list updated.  Objective:   Vitals:   10/05/19 1004  BP: 102/83  Pulse: 85  Weight: 170 lb (77.1 kg)    Fetal Status: Fetal Heart Rate (bpm): 135 Fundal Height: 37 cm Movement: Present     General:  Alert, oriented and cooperative. Patient is in no acute distress.  Skin: Skin is warm and dry. No rash noted.   Cardiovascular: Normal heart rate noted  Respiratory: Normal respiratory effort, no problems with respiration noted  Abdomen: Soft, gravid, appropriate for gestational age.  Pain/Pressure: Present     Pelvic: Cervical exam deferred        Extremities: Normal range of motion.  Edema: Trace  Mental Status:  Normal mood and affect. Normal behavior. Normal judgment and thought content.   Assessment and Plan:  Pregnancy: [id number] at [redacted]w[redacted]d  1. Supervision of high risk pregnancy, antepartum Fundal height increased , EFW at 97%, per MFM recommended delivery at 39-40 weeks 36 week labs at next visit  2. Multigravida of advanced maternal age in third trimester   3. Pap smear abnormality of cervix with LGSIL   4. Gastroesophageal reflux disease without  esophagitis  - omeprazole (PRILOSEC) 40 MG capsule; Take 1 capsule (40 mg total) by mouth daily.  Dispense: 30 capsule; Refill: 1  Preterm labor symptoms and general obstetric precautions including but not limited to vaginal bleeding, contractions, leaking of fluid and fetal movement were reviewed in detail with the patient.  Please refer to After Visit Summary for other counseling recommendations.   Return in about 2 weeks (around 10/19/2019) for 36 weeks swabs.   Mariel Aloe, MD

## 2019-10-10 ENCOUNTER — Ambulatory Visit: Payer: Medicaid Other | Admitting: *Deleted

## 2019-10-10 ENCOUNTER — Other Ambulatory Visit: Payer: Self-pay | Admitting: *Deleted

## 2019-10-10 ENCOUNTER — Encounter: Payer: Self-pay | Admitting: *Deleted

## 2019-10-10 ENCOUNTER — Other Ambulatory Visit: Payer: Self-pay

## 2019-10-10 ENCOUNTER — Ambulatory Visit: Payer: Medicaid Other | Attending: Obstetrics and Gynecology

## 2019-10-10 VITALS — BP 105/68 | HR 81

## 2019-10-10 DIAGNOSIS — O09523 Supervision of elderly multigravida, third trimester: Secondary | ICD-10-CM | POA: Diagnosis present

## 2019-10-10 DIAGNOSIS — Z3A35 35 weeks gestation of pregnancy: Secondary | ICD-10-CM | POA: Diagnosis not present

## 2019-10-10 DIAGNOSIS — O321XX0 Maternal care for breech presentation, not applicable or unspecified: Secondary | ICD-10-CM

## 2019-10-17 ENCOUNTER — Ambulatory Visit: Payer: Medicaid Other | Attending: Obstetrics and Gynecology

## 2019-10-17 ENCOUNTER — Ambulatory Visit: Payer: Medicaid Other | Admitting: *Deleted

## 2019-10-17 ENCOUNTER — Other Ambulatory Visit: Payer: Self-pay

## 2019-10-17 ENCOUNTER — Encounter: Payer: Self-pay | Admitting: *Deleted

## 2019-10-17 VITALS — BP 119/71 | HR 79

## 2019-10-17 DIAGNOSIS — O09523 Supervision of elderly multigravida, third trimester: Secondary | ICD-10-CM | POA: Diagnosis not present

## 2019-10-17 DIAGNOSIS — Z3A36 36 weeks gestation of pregnancy: Secondary | ICD-10-CM

## 2019-10-17 DIAGNOSIS — Z362 Encounter for other antenatal screening follow-up: Secondary | ICD-10-CM

## 2019-10-20 ENCOUNTER — Other Ambulatory Visit: Payer: Self-pay

## 2019-10-20 ENCOUNTER — Ambulatory Visit (INDEPENDENT_AMBULATORY_CARE_PROVIDER_SITE_OTHER): Payer: Medicaid Other | Admitting: Obstetrics and Gynecology

## 2019-10-20 ENCOUNTER — Other Ambulatory Visit (HOSPITAL_COMMUNITY)
Admission: RE | Admit: 2019-10-20 | Discharge: 2019-10-20 | Disposition: A | Discharge status: Home / Self Care | Payer: Medicaid Other | Source: Ambulatory Visit | Attending: Obstetrics and Gynecology | Admitting: Obstetrics and Gynecology

## 2019-10-20 VITALS — BP 114/77 | HR 76 | Wt 177.6 lb

## 2019-10-20 DIAGNOSIS — O3663X0 Maternal care for excessive fetal growth, third trimester, not applicable or unspecified: Secondary | ICD-10-CM

## 2019-10-20 DIAGNOSIS — O0990 Supervision of high risk pregnancy, unspecified, unspecified trimester: Secondary | ICD-10-CM | POA: Diagnosis not present

## 2019-10-20 DIAGNOSIS — O09523 Supervision of elderly multigravida, third trimester: Secondary | ICD-10-CM

## 2019-10-20 DIAGNOSIS — Z3A36 36 weeks gestation of pregnancy: Secondary | ICD-10-CM

## 2019-10-20 DIAGNOSIS — R87612 Low grade squamous intraepithelial lesion on cytologic smear of cervix (LGSIL): Secondary | ICD-10-CM

## 2019-10-20 NOTE — Progress Notes (Signed)
Prenatal Visit Note Date: 10/20/2019 Clinic: [facility]  Subjective:  Clariece Roesler is a 44 y.o. [id number] at [redacted]w[redacted]d being seen today for ongoing prenatal care.  She is currently monitored for the following issues for this high-risk pregnancy and has Supervision of high risk pregnancy, antepartum; AMA (advanced maternal age) multigravida 36+; Pap smear abnormality of cervix with LGSIL; and Excessive fetal growth affecting management of mother in third trimester, antepartum on their problem list.  Patient reports no complaints.   Contractions: Irritability. Vag. Bleeding: None.  Movement: Present. Denies leaking of fluid.   The following portions of the patient's history were reviewed and updated as appropriate: allergies, current medications, past family history, past medical history, past social history, past surgical history and problem list. Problem list updated.  Objective:   Vitals:   10/20/19 0849  BP: 114/77  Pulse: 76  Weight: 177 lb 9.6 oz (80.6 kg)    Fetal Status: Fetal Heart Rate (bpm): 122   Movement: Present     General:  Alert, oriented and cooperative. Patient is in no acute distress.  Skin: Skin is warm and dry. No rash noted.   Cardiovascular: Normal heart rate noted  Respiratory: Normal respiratory effort, no problems with respiration noted  Abdomen: Soft, gravid, appropriate for gestational age. Pain/Pressure: Present     Pelvic:  Cervical exam deferred        Extremities: Normal range of motion.  Edema: Deep pitting, indentation remains for a short time  Mental Status: Normal mood and affect. Normal behavior. Normal judgment and thought content.   Urinalysis:      Assessment and Plan:  Pregnancy: [id number] at [redacted]w[redacted]d  1. Supervision of high risk pregnancy, antepartum Set up IOL nv Routine care. BC d/w pt and thinking about nexplanon. Compression stockings recommended.  - GC/Chlamydia probe amp ([place])not at [facility] - Culture, beta strep  (group b only)  2. Multigravida of advanced maternal age in third trimester Continue with weekly testing. 8/8 on 10/12. Has rpt growth on 10/19  3. Pap smear abnormality of cervix with LGSIL colpo PP  4. Excessive fetal growth affecting management of pregnancy in third trimester, single or unspecified fetus 9/22: 97%, 2774gm, ac>99%. She has a normal 2h GTT and largester prior is 4054gm and she states this baby feels about the same or maybe a little bigger.   Preterm labor symptoms and general obstetric precautions including but not limited to vaginal bleeding, contractions, leaking of fluid and fetal movement were reviewed in detail with the patient. Please refer to After Visit Summary for other counseling recommendations.  RTC: Rosalin Hawking, MD

## 2019-10-20 NOTE — Progress Notes (Deleted)
122

## 2019-10-23 LAB — GC/CHLAMYDIA PROBE AMP (~~LOC~~) NOT AT ARMC
Chlamydia: NEGATIVE
Comment: NEGATIVE
Comment: NORMAL
Neisseria Gonorrhea: NEGATIVE

## 2019-10-24 ENCOUNTER — Encounter: Payer: Self-pay | Admitting: *Deleted

## 2019-10-24 ENCOUNTER — Ambulatory Visit: Payer: Medicaid Other | Admitting: *Deleted

## 2019-10-24 ENCOUNTER — Ambulatory Visit: Payer: Medicaid Other | Attending: Obstetrics and Gynecology

## 2019-10-24 ENCOUNTER — Other Ambulatory Visit: Payer: Self-pay

## 2019-10-24 ENCOUNTER — Other Ambulatory Visit: Payer: Self-pay | Admitting: Obstetrics and Gynecology

## 2019-10-24 VITALS — BP 116/58 | HR 70

## 2019-10-24 DIAGNOSIS — O09523 Supervision of elderly multigravida, third trimester: Secondary | ICD-10-CM

## 2019-10-24 DIAGNOSIS — Z3A37 37 weeks gestation of pregnancy: Secondary | ICD-10-CM

## 2019-10-24 DIAGNOSIS — O09529 Supervision of elderly multigravida, unspecified trimester: Secondary | ICD-10-CM | POA: Insufficient documentation

## 2019-10-24 LAB — CULTURE, BETA STREP (GROUP B ONLY): Strep Gp B Culture: NEGATIVE

## 2019-10-24 NOTE — Procedures (Signed)
Marybel Alcott 1975-02-21 [redacted]w[redacted]d  Fetus A Non-Stress Test Interpretation for 10/24/19  Indication: Advanced Maternal Age >40 years, Failed BPP  Fetal Heart Rate A Mode: External Baseline Rate (A): 125 bpm Variability: Moderate Accelerations: 15 x 15 Decelerations: None Multiple birth?: No  Uterine Activity Mode: Toco Contraction Frequency (min): none Resting Tone Palpated: Relaxed Resting Time: Adequate  Interpretation (Fetal Testing) Nonstress Test Interpretation: Reactive Comments: tracing reviewed by Dr. Parke Poisson

## 2019-10-25 ENCOUNTER — Institutional Professional Consult (permissible substitution): Payer: Medicaid Other

## 2019-10-27 ENCOUNTER — Encounter: Payer: Self-pay | Admitting: Obstetrics & Gynecology

## 2019-10-27 ENCOUNTER — Ambulatory Visit (INDEPENDENT_AMBULATORY_CARE_PROVIDER_SITE_OTHER): Payer: Medicaid Other | Admitting: Obstetrics & Gynecology

## 2019-10-27 ENCOUNTER — Other Ambulatory Visit: Payer: Self-pay

## 2019-10-27 VITALS — BP 108/68 | HR 71

## 2019-10-27 DIAGNOSIS — O0990 Supervision of high risk pregnancy, unspecified, unspecified trimester: Secondary | ICD-10-CM

## 2019-10-27 DIAGNOSIS — O3663X0 Maternal care for excessive fetal growth, third trimester, not applicable or unspecified: Secondary | ICD-10-CM

## 2019-10-27 NOTE — Patient Instructions (Signed)

## 2019-10-27 NOTE — Progress Notes (Signed)
° °  PRENATAL VISIT NOTE  Subjective:  Tracey Hale is a 44 y.o. [id number] at [redacted]w[redacted]d being seen today for ongoing prenatal care.  She is currently monitored for the following issues for this high-risk pregnancy and has Supervision of high risk pregnancy, antepartum; AMA (advanced maternal age) multigravida 6+; Pap smear abnormality of cervix with LGSIL; and Excessive fetal growth affecting management of mother in third trimester, antepartum on their problem list.  Patient reports bilateral CTS.  Contractions: Irritability. Vag. Bleeding: None.  Movement: Present. Denies leaking of fluid.   The following portions of the patient's history were reviewed and updated as appropriate: allergies, current medications, past family history, past medical history, past social history, past surgical history and problem list.   Objective:   Vitals:   10/27/19 1013  BP: 108/68  Pulse: 71    Fetal Status: Fetal Heart Rate (bpm): 135 Fundal Height: 39 cm Movement: Present     General:  Alert, oriented and cooperative. Patient is in no acute distress.  Skin: Skin is warm and dry. No rash noted.   Cardiovascular: Normal heart rate noted  Respiratory: Normal respiratory effort, no problems with respiration noted  Abdomen: Soft, gravid, appropriate for gestational age.  Pain/Pressure: Present     Pelvic: Cervical exam deferred        Extremities: Normal range of motion.  Edema: Trace  Mental Status: Normal mood and affect. Normal behavior. Normal judgment and thought content.   Assessment and Plan:  Pregnancy: [id number] at [redacted]w[redacted]d 1. Supervision of high risk pregnancy, antepartum US shows macrosomia  2. Excessive fetal growth affecting management of pregnancy in third trimester, single or unspecified fetus Patient has delivered 4000 g baby  Term labor symptoms and general obstetric precautions including but not limited to vaginal bleeding, contractions, leaking of fluid and fetal movement were reviewed in detail  with the patient. Please refer to After Visit Summary for other counseling recommendations.   Return in about 1 week (around 11/03/2019).  Future Appointments  Date Time Provider Department Center  10/31/2019 11:00 AM [facility] NURSE [facility]  10/31/2019 11:15 AM [facility]2 [facility]  11/03/2019  8:55 AM Merkel Bing, MD [facility]    Scheryl Darter, MD

## 2019-10-31 ENCOUNTER — Ambulatory Visit: Payer: Medicaid Other | Attending: Obstetrics and Gynecology

## 2019-10-31 ENCOUNTER — Ambulatory Visit: Payer: Medicaid Other | Admitting: *Deleted

## 2019-10-31 ENCOUNTER — Encounter: Payer: Self-pay | Admitting: *Deleted

## 2019-10-31 ENCOUNTER — Other Ambulatory Visit: Payer: Self-pay | Admitting: *Deleted

## 2019-10-31 ENCOUNTER — Other Ambulatory Visit: Payer: Self-pay

## 2019-10-31 VITALS — BP 123/83 | HR 78

## 2019-10-31 DIAGNOSIS — O09523 Supervision of elderly multigravida, third trimester: Secondary | ICD-10-CM

## 2019-10-31 DIAGNOSIS — Z3A38 38 weeks gestation of pregnancy: Secondary | ICD-10-CM

## 2019-10-31 DIAGNOSIS — O322XX0 Maternal care for transverse and oblique lie, not applicable or unspecified: Secondary | ICD-10-CM | POA: Diagnosis not present

## 2019-11-03 ENCOUNTER — Ambulatory Visit (INDEPENDENT_AMBULATORY_CARE_PROVIDER_SITE_OTHER): Payer: Medicaid Other | Admitting: Obstetrics and Gynecology

## 2019-11-03 ENCOUNTER — Other Ambulatory Visit (HOSPITAL_COMMUNITY)
Admission: RE | Admit: 2019-11-03 | Discharge: 2019-11-03 | Disposition: A | Discharge status: Home / Self Care | Payer: Medicaid Other | Source: Ambulatory Visit | Attending: Obstetrics and Gynecology | Admitting: Obstetrics and Gynecology

## 2019-11-03 ENCOUNTER — Other Ambulatory Visit: Payer: Self-pay

## 2019-11-03 ENCOUNTER — Encounter (HOSPITAL_COMMUNITY): Payer: Self-pay

## 2019-11-03 ENCOUNTER — Encounter (HOSPITAL_COMMUNITY)
Admission: RE | Admit: 2019-11-03 | Discharge: 2019-11-03 | Disposition: A | Discharge status: Home / Self Care | Payer: Medicaid Other | Source: Ambulatory Visit | Attending: Obstetrics and Gynecology | Admitting: Obstetrics and Gynecology

## 2019-11-03 VITALS — BP 116/86 | HR 85 | Wt 181.2 lb

## 2019-11-03 DIAGNOSIS — O09523 Supervision of elderly multigravida, third trimester: Secondary | ICD-10-CM

## 2019-11-03 DIAGNOSIS — Z3A38 38 weeks gestation of pregnancy: Secondary | ICD-10-CM

## 2019-11-03 DIAGNOSIS — O320XX0 Maternal care for unstable lie, not applicable or unspecified: Secondary | ICD-10-CM

## 2019-11-03 DIAGNOSIS — O3663X0 Maternal care for excessive fetal growth, third trimester, not applicable or unspecified: Secondary | ICD-10-CM

## 2019-11-03 DIAGNOSIS — Z01812 Encounter for preprocedural laboratory examination: Secondary | ICD-10-CM | POA: Diagnosis not present

## 2019-11-03 DIAGNOSIS — Z20822 Contact with and (suspected) exposure to covid-19: Secondary | ICD-10-CM | POA: Insufficient documentation

## 2019-11-03 DIAGNOSIS — O0990 Supervision of high risk pregnancy, unspecified, unspecified trimester: Secondary | ICD-10-CM

## 2019-11-03 DIAGNOSIS — R600 Localized edema: Secondary | ICD-10-CM

## 2019-11-03 DIAGNOSIS — O329XX0 Maternal care for malpresentation of fetus, unspecified, not applicable or unspecified: Secondary | ICD-10-CM | POA: Insufficient documentation

## 2019-11-03 LAB — CBC
HCT: 29.1 % — ABNORMAL LOW (ref 36.0–46.0)
Hemoglobin: 8.7 g/dL — ABNORMAL LOW (ref 12.0–15.0)
MCH: 23 pg — ABNORMAL LOW (ref 26.0–34.0)
MCHC: 29.9 g/dL — ABNORMAL LOW (ref 30.0–36.0)
MCV: 76.8 fL — ABNORMAL LOW (ref 80.0–100.0)
Platelets: 265 10*3/uL (ref 150–400)
RBC: 3.79 MIL/uL — ABNORMAL LOW (ref 3.87–5.11)
RDW: 16.5 % — ABNORMAL HIGH (ref 11.5–15.5)
WBC: 9.7 10*3/uL (ref 4.0–10.5)
nRBC: 0.4 % — ABNORMAL HIGH (ref 0.0–0.2)

## 2019-11-03 NOTE — Patient Instructions (Signed)
Tracey Hale  11/03/2019   Your procedure is scheduled on:  11/06/2019  Arrive at 0800 at Entrance C on [address] at [facility]  and [employer]. You are invited to use the FREE valet parking or use the Visitor's parking deck.  Pick up the phone at the desk and dial [phone].  Call this number if you have problems the morning of surgery: [phone]  Remember:   Do not eat food:(After Midnight) Desps de medianoche.  Do not drink clear liquids: (After Midnight) Desps de medianoche.  Take these medicines the morning of surgery with A SIP OF WATER:  none   Do not wear jewelry, make-up or nail polish.  Do not wear lotions, powders, or perfumes. Do not wear deodorant.  Do not shave 48 hours prior to surgery.  Do not bring valuables to the hospital.  [facility] is not   responsible for any belongings or valuables brought to the hospital.  Contacts, dentures or bridgework may not be worn into surgery.  Leave suitcase in the car. After surgery it may be brought to your room.  For patients admitted to the hospital, checkout time is 11:00 AM the day of              discharge.      Please read over the following fact sheets that you were given:     Preparing for Surgery

## 2019-11-03 NOTE — Progress Notes (Signed)
Prenatal Visit Note Date: 11/03/2019 Clinic: [facility]  Subjective:  Tracey Hale is a 44 y.o. [id number] at [redacted]w[redacted]d being seen today for ongoing prenatal care.  She is currently monitored for the following issues for this high-risk pregnancy and has Supervision of high risk pregnancy, antepartum; AMA (advanced maternal age) multigravida 63+; Pap smear abnormality of cervix with LGSIL; Excessive fetal growth affecting management of mother in third trimester, antepartum; and Malpresentation before onset of labor on their problem list.  Patient reports b/l LE edema, low belly pressure, low back discomfort.   Contractions: Irregular. Vag. Bleeding: None.  Movement: Present. Denies leaking of fluid.   The following portions of the patient's history were reviewed and updated as appropriate: allergies, current medications, past family history, past medical history, past social history, past surgical history and problem list. Problem list updated.  Objective:   Vitals:   11/03/19 0856  BP: 116/86  Pulse: 85  Weight: 181 lb 3.2 oz (82.2 kg)    Fetal Status: Fetal Heart Rate (bpm): 138   Movement: Present     General:  Alert, oriented and cooperative. Patient is in no acute distress.  Skin: Skin is warm and dry. No rash noted.   Cardiovascular: Normal heart rate noted  Respiratory: Normal respiratory effort, no problems with respiration noted  Abdomen: Soft, gravid, appropriate for gestational age. Pain/Pressure: Present     Pelvic:  Cervical exam deferred        Extremities: Normal range of motion.  Edema: Deep pitting, indentation remains for a short time  Mental Status: Normal mood and affect. Normal behavior. Normal judgment and thought content.   Urinalysis:      Assessment and Plan:  Pregnancy: [id number] at [redacted]w[redacted]d  1. Supervision of high risk pregnancy, antepartum Routine care. Nexplanon.   2. Excessive fetal growth affecting management of pregnancy in third  trimester, single or unspecified fetus 4037gm, >99% on efw and ac on 10/19, normal AFI. bpp 8/8 and normal afi on 10/26. Pt had normal 2h GTT this pregnancy. Largest prior was 4057gm in 2009 and 3827 in 2012. Pt states this baby feels is larger than her priors. See below  3. Multigravida of advanced maternal age in third trimester Continue qwk testing. Delivery at 39-40wks. See below  4. Malpresentation before onset of labor, single or unspecified fetus Breech on 10/19 and transverse on 10/26. No mention in notes re: delivery planning and pt not already set up for c/s or IOL. Patient complete breech with back maternal left on bedside u/s today. D/w her re: ecv and c/s and she desires c/s, no ecv and no btl.  I told her we would scan her before her c/s and if cephalic then can proceed with IOL which is what she wants. Pt extrapolates to 4400-4500gm. D/w her re: shoulder dystocia risk if for IOL.  Pt scheduled for primary c/s 11/1 @ 0900. D/w her NPO and to be there 2-1.5 hours before her surgery. Labs today  5. Unstable fetal lie, single or unspecified fetus  6. [redacted] weeks gestation of pregnancy  7. Lower extremity edema Behavioral interventions. TED hoses  Term labor symptoms and general obstetric precautions including but not limited to vaginal bleeding, contractions, leaking of fluid and fetal movement were reviewed in detail with the patient. Please refer to After Visit Summary for other counseling recommendations.  Return in about 10 days (around 11/13/2019) for rn incision check. [place] Bing, MD

## 2019-11-04 LAB — RPR: RPR Ser Ql: NONREACTIVE

## 2019-11-04 LAB — SARS CORONAVIRUS 2 (TAT 6-24 HRS): SARS Coronavirus 2: NEGATIVE

## 2019-11-05 ENCOUNTER — Encounter: Payer: Self-pay | Admitting: Obstetrics and Gynecology

## 2019-11-05 ENCOUNTER — Encounter (HOSPITAL_COMMUNITY): Payer: Self-pay | Admitting: Anesthesiology

## 2019-11-05 DIAGNOSIS — O99019 Anemia complicating pregnancy, unspecified trimester: Secondary | ICD-10-CM | POA: Insufficient documentation

## 2019-11-06 ENCOUNTER — Ambulatory Visit: Payer: Medicaid Other

## 2019-11-06 ENCOUNTER — Other Ambulatory Visit: Payer: Self-pay

## 2019-11-06 ENCOUNTER — Encounter (HOSPITAL_COMMUNITY): Payer: Self-pay | Admitting: Obstetrics and Gynecology

## 2019-11-06 ENCOUNTER — Encounter (HOSPITAL_COMMUNITY)
Admission: RE | Disposition: A | Discharge status: Home / Self Care | Payer: Self-pay | Source: Home / Self Care | Attending: Family Medicine

## 2019-11-06 ENCOUNTER — Ambulatory Visit: Payer: Medicaid Other | Attending: Obstetrics and Gynecology

## 2019-11-06 ENCOUNTER — Inpatient Hospital Stay (HOSPITAL_COMMUNITY)
Admission: RE | Admit: 2019-11-06 | Discharge: 2019-11-09 | DRG: 805 | Disposition: A | Discharge status: Home / Self Care | Payer: Medicaid Other | Attending: Family Medicine | Admitting: Family Medicine

## 2019-11-06 DIAGNOSIS — Z3A39 39 weeks gestation of pregnancy: Secondary | ICD-10-CM

## 2019-11-06 DIAGNOSIS — O3663X0 Maternal care for excessive fetal growth, third trimester, not applicable or unspecified: Secondary | ICD-10-CM | POA: Diagnosis present

## 2019-11-06 DIAGNOSIS — O411230 Chorioamnionitis, third trimester, not applicable or unspecified: Secondary | ICD-10-CM | POA: Diagnosis not present

## 2019-11-06 DIAGNOSIS — Z23 Encounter for immunization: Secondary | ICD-10-CM

## 2019-11-06 DIAGNOSIS — Z30017 Encounter for initial prescription of implantable subdermal contraceptive: Secondary | ICD-10-CM

## 2019-11-06 DIAGNOSIS — O9902 Anemia complicating childbirth: Secondary | ICD-10-CM | POA: Diagnosis not present

## 2019-11-06 DIAGNOSIS — D649 Anemia, unspecified: Secondary | ICD-10-CM | POA: Diagnosis not present

## 2019-11-06 DIAGNOSIS — O328XX0 Maternal care for other malpresentation of fetus, not applicable or unspecified: Secondary | ICD-10-CM | POA: Diagnosis not present

## 2019-11-06 DIAGNOSIS — Z20822 Contact with and (suspected) exposure to covid-19: Secondary | ICD-10-CM | POA: Diagnosis not present

## 2019-11-06 DIAGNOSIS — O321XX0 Maternal care for breech presentation, not applicable or unspecified: Secondary | ICD-10-CM | POA: Diagnosis present

## 2019-11-06 DIAGNOSIS — Z3490 Encounter for supervision of normal pregnancy, unspecified, unspecified trimester: Secondary | ICD-10-CM

## 2019-11-06 LAB — CBC
HCT: 26.8 % — ABNORMAL LOW (ref 36.0–46.0)
Hemoglobin: 8 g/dL — ABNORMAL LOW (ref 12.0–15.0)
MCH: 23.2 pg — ABNORMAL LOW (ref 26.0–34.0)
MCHC: 29.9 g/dL — ABNORMAL LOW (ref 30.0–36.0)
MCV: 77.7 fL — ABNORMAL LOW (ref 80.0–100.0)
Platelets: 241 10*3/uL (ref 150–400)
RBC: 3.45 MIL/uL — ABNORMAL LOW (ref 3.87–5.11)
RDW: 16.8 % — ABNORMAL HIGH (ref 11.5–15.5)
WBC: 11.4 10*3/uL — ABNORMAL HIGH (ref 4.0–10.5)
nRBC: 0.6 % — ABNORMAL HIGH (ref 0.0–0.2)

## 2019-11-06 LAB — PREPARE RBC (CROSSMATCH)

## 2019-11-06 LAB — RPR: RPR Ser Ql: NONREACTIVE

## 2019-11-06 SURGERY — Surgical Case
Anesthesia: Regional

## 2019-11-06 MED ORDER — LACTATED RINGERS IV SOLN: 500.0000 mL | INTRAVENOUS | Status: DC | PRN

## 2019-11-06 MED ORDER — MORPHINE SULFATE (PF) 0.5 MG/ML IJ SOLN
INTRAMUSCULAR | Status: AC
  Filled 2019-11-06: qty 10

## 2019-11-06 MED ORDER — ACETAMINOPHEN 325 MG PO TABS: 650.0000 mg | ORAL_TABLET | ORAL | Status: DC | PRN

## 2019-11-06 MED ORDER — FENTANYL CITRATE (PF) 100 MCG/2ML IJ SOLN
100.0000 ug | INTRAMUSCULAR | Status: DC | PRN
  Administered 2019-11-06 (×3): 100 ug via INTRAVENOUS
  Filled 2019-11-06 (×3): qty 2

## 2019-11-06 MED ORDER — LIDOCAINE HCL (PF) 1 % IJ SOLN: 30.0000 mL | INTRAMUSCULAR | Status: DC | PRN

## 2019-11-06 MED ORDER — TERBUTALINE SULFATE 1 MG/ML IJ SOLN
INTRAMUSCULAR | Status: AC
  Filled 2019-11-06: qty 1

## 2019-11-06 MED ORDER — TERBUTALINE SULFATE 1 MG/ML IJ SOLN: 0.2500 mg | Freq: Once | INTRAMUSCULAR | Status: DC | PRN

## 2019-11-06 MED ORDER — PHENYLEPHRINE HCL-NACL 20-0.9 MG/250ML-% IV SOLN
INTRAVENOUS | Status: AC
  Filled 2019-11-06: qty 250

## 2019-11-06 MED ORDER — LACTATED RINGERS IV SOLN: INTRAVENOUS | Status: DC

## 2019-11-06 MED ORDER — SODIUM CHLORIDE 0.9% IV SOLUTION: Freq: Once | INTRAVENOUS | Status: DC

## 2019-11-06 MED ORDER — FENTANYL-BUPIVACAINE-NACL 0.5-0.125-0.9 MG/250ML-% EP SOLN
12.0000 mL/h | EPIDURAL | Status: DC | PRN
  Filled 2019-11-06: qty 250

## 2019-11-06 MED ORDER — OXYTOCIN BOLUS FROM INFUSION
333.0000 mL | Freq: Once | INTRAVENOUS | Status: AC
  Administered 2019-11-07: 333 mL via INTRAVENOUS

## 2019-11-06 MED ORDER — SOD CITRATE-CITRIC ACID 500-334 MG/5ML PO SOLN
30.0000 mL | ORAL | Status: DC | PRN
  Administered 2019-11-06: 30 mL via ORAL
  Filled 2019-11-06: qty 15

## 2019-11-06 MED ORDER — OXYTOCIN-SODIUM CHLORIDE 30-0.9 UT/500ML-% IV SOLN: 2.5000 [IU]/h | INTRAVENOUS | Status: DC

## 2019-11-06 MED ORDER — MISOPROSTOL 25 MCG QUARTER TABLET
ORAL_TABLET | ORAL | Status: AC
  Administered 2019-11-06: 25 ug via VAGINAL
  Filled 2019-11-06: qty 1

## 2019-11-06 MED ORDER — PHENYLEPHRINE 40 MCG/ML (10ML) SYRINGE FOR IV PUSH (FOR BLOOD PRESSURE SUPPORT): 80.0000 ug | PREFILLED_SYRINGE | INTRAVENOUS | Status: DC | PRN

## 2019-11-06 MED ORDER — SOD CITRATE-CITRIC ACID 500-334 MG/5ML PO SOLN: 30.0000 mL | ORAL | Status: DC

## 2019-11-06 MED ORDER — EPHEDRINE 5 MG/ML INJ: 10.0000 mg | INTRAVENOUS | Status: DC | PRN

## 2019-11-06 MED ORDER — FENTANYL CITRATE (PF) 100 MCG/2ML IJ SOLN
INTRAMUSCULAR | Status: AC
  Filled 2019-11-06: qty 2

## 2019-11-06 MED ORDER — CEFAZOLIN SODIUM-DEXTROSE 2-4 GM/100ML-% IV SOLN
INTRAVENOUS | Status: AC
  Filled 2019-11-06: qty 100

## 2019-11-06 MED ORDER — LACTATED RINGERS IV SOLN
500.0000 mL | Freq: Once | INTRAVENOUS | Status: AC
  Administered 2019-11-06: 500 mL via INTRAVENOUS

## 2019-11-06 MED ORDER — MISOPROSTOL 25 MCG QUARTER TABLET: 25.0000 ug | ORAL_TABLET | ORAL | Status: DC

## 2019-11-06 MED ORDER — OXYTOCIN-SODIUM CHLORIDE 30-0.9 UT/500ML-% IV SOLN
1.0000 m[IU]/min | INTRAVENOUS | Status: DC
  Administered 2019-11-07: 2 m[IU]/min via INTRAVENOUS
  Filled 2019-11-06: qty 500

## 2019-11-06 MED ORDER — OXYTOCIN-SODIUM CHLORIDE 30-0.9 UT/500ML-% IV SOLN
INTRAVENOUS | Status: AC
  Filled 2019-11-06: qty 500

## 2019-11-06 MED ORDER — CEFAZOLIN SODIUM-DEXTROSE 2-4 GM/100ML-% IV SOLN: 2.0000 g | INTRAVENOUS | Status: DC

## 2019-11-06 MED ORDER — MISOPROSTOL 50MCG HALF TABLET
50.0000 ug | ORAL_TABLET | ORAL | Status: DC | PRN
  Administered 2019-11-06: 50 ug via BUCCAL
  Filled 2019-11-06: qty 1

## 2019-11-06 MED ORDER — TERBUTALINE SULFATE 1 MG/ML IJ SOLN
0.2500 mg | Freq: Once | INTRAMUSCULAR | Status: AC
  Administered 2019-11-06: 0.25 mg via SUBCUTANEOUS

## 2019-11-06 MED ORDER — ONDANSETRON HCL 4 MG/2ML IJ SOLN
4.0000 mg | Freq: Four times a day (QID) | INTRAMUSCULAR | Status: DC | PRN
  Administered 2019-11-06 – 2019-11-07 (×2): 4 mg via INTRAVENOUS
  Filled 2019-11-06 (×2): qty 2

## 2019-11-06 MED ORDER — DIPHENHYDRAMINE HCL 50 MG/ML IJ SOLN: 12.5000 mg | INTRAMUSCULAR | Status: DC | PRN

## 2019-11-06 MED ORDER — ONDANSETRON HCL 4 MG/2ML IJ SOLN
INTRAMUSCULAR | Status: AC
  Filled 2019-11-06: qty 2

## 2019-11-06 NOTE — Progress Notes (Signed)
Patient Vitals for the past 4 hrs:  BP Temp Temp src Pulse Resp SpO2  11/06/19 2054 127/82 -- -- 70 -- --  11/06/19 1950 123/88 -- -- 77 -- 98 %  11/06/19 1936 -- -- -- -- -- 100 %  11/06/19 1931 -- -- -- -- -- 100 %  11/06/19 1926 -- -- -- -- -- 100 %  11/06/19 1916 -- -- -- -- -- 100 %  11/06/19 1913 -- 97.8 F (36.6 C) Oral -- -- --  11/06/19 1845 137/81 98.1 F (36.7 C) Oral 70 18 --   Foley out, ctx q 3-5 mintues.  Cx 5-6/50/-2, vtx confirmed w/US.  Cytotec placed in post fornix.  Plan to start pitocin in 4 hours unless laboring.

## 2019-11-06 NOTE — Progress Notes (Signed)
Labor Progress Note Siria Calandro is a 44 y.o. [id number] at 103w0d presented for IOL for LGA. S: Feeling cramps with contractions    O:  BP 124/83   Pulse 83   Temp 98.6 F (37 C) (Axillary)   Resp 16   Ht 5\' 2"  (1.575 m)   Wt 82.1 kg   LMP 02/06/2019   BMI 33.11 kg/m  EFM: 150/moderate variability overall, periods of minimal but with return/pos accels/neg decels   CVE: Dilation: 1 Effacement (%): 50 Station: -2 Presentation: Vertex Exam by:: Dr. [email or web address]   A&P: 44 y.o. 45 [redacted]w[redacted]d here for IOL for LGA s/p successful ECV  #Induction of Labor: S/p Cytotec at 93. FB placed at 1515 without difficulty, patient now contracting q4min painfully. Will defer medication at this time and evaluate prn.   #Pain: fentanyl x1 now, epidural prn  #FWB: cat I  #GBS negative   #LGA: 4037 g @ 37+1 with AC >99%. No GDM. Extrapolates to about 4550 g. Hx successful VD of 4054 g child. SD precautions at delivery.   3m, MD 3:27 PM

## 2019-11-06 NOTE — Progress Notes (Signed)
Labor Progress Note Tracey Hale is a 44 y.o. L2H5747 at [redacted]w[redacted]d presented for IOL for LGA. S: Ctx have gotten more painful. Feeling bummed that she might deliver on a Tuesday as this means her kid will be 'hard headed.'    O:  BP 137/81   Pulse 70   Temp 98.1 F (36.7 C) (Oral)   Resp 18   Ht 5\' 2"  (1.575 m)   Wt 82.1 kg   LMP 02/06/2019   SpO2 100%   BMI 33.11 kg/m  EFM: 135/moderate variability/pos accels/neg decels   CVE: Dilation: 1 Effacement (%): 50 Station: -2 Presentation: Vertex Exam by:: Dr. [email or web address] FB in place   A&P: 44 y.o. 45 [redacted]w[redacted]d here for IOL for LGA s/p successful ECV   #Induction of Labor: S/p Cytotec at 4. FB placed at 1515 without difficulty, still in place. Continues to contract q2 min. Will defer medication at this time and evaluate prn.   #Pain: fentanyl, epidural prn  #FWB: cat I  #GBS negative   #LGA: 4037 g @ 37+1 with AC >99%. No GDM. Extrapolates to about 4550 g. Hx successful VD of 4054 g child. SD precautions at delivery.   20, MD 7:01 PM

## 2019-11-06 NOTE — H&P (Addendum)
LABOR AND DELIVERY ADMISSION HISTORY AND PHYSICAL NOTE  Tracey Hale is a 44 y.o. female [phone] with IUP at [redacted]w[redacted]d by L/13 presenting for scheduled cesarean section.   She reports positive fetal movement. She denies leakage of fluid or vaginal bleeding.  Prenatal History/Complications:  Past Medical History: Past Medical History:  Diagnosis Date  . Medical history non-contributory     Past Surgical History: Past Surgical History:  Procedure Laterality Date  . NO PAST SURGERIES      Obstetrical History: OB History    Gravida  4   Para  2   Term  2   Preterm      AB  1   Living  2     SAB  1   TAB      Ectopic      Multiple      Live Births  2           Social History: Social History   Socioeconomic History  . Marital status: Divorced    Spouse name: Not on file  . Number of children: Not on file  . Years of education: Not on file  . Highest education level: Not on file  Occupational History  . Not on file  Tobacco Use  . Smoking status: Never Smoker  . Smokeless tobacco: Never Used  Vaping Use  . Vaping Use: Never used  Substance and Sexual Activity  . Alcohol use: Not Currently    Comment: occasionally  . Drug use: Never  . Sexual activity: Yes    Birth control/protection: None  Other Topics Concern  . Not on file  Social History Narrative  . Not on file   Social Determinants of Health   Financial Resource Strain:   . Difficulty of Paying Living Expenses: Not on file  Food Insecurity: No Food Insecurity  . Worried About Programme researcher, broadcasting/film/video in the Last Year: Never true  . Ran Out of Food in the Last Year: Never true  Transportation Needs: No Transportation Needs  . Lack of Transportation (Medical): No  . Lack of Transportation (Non-Medical): No  Physical Activity:   . Days of Exercise per Week: Not on file  . Minutes of Exercise per Session: Not on file  Stress:   . Feeling of Stress : Not on file  Social Connections:   .  Frequency of Communication with Friends and Family: Not on file  . Frequency of Social Gatherings with Friends and Family: Not on file  . Attends Religious Services: Not on file  . Active Member of Clubs or Organizations: Not on file  . Attends Banker Meetings: Not on file  . Marital Status: Not on file    Family History: Family History  Problem Relation Age of Onset  . Hypertension Mother   . Hypertension Father     Allergies: No Known Allergies  Medications Prior to Admission  Medication Sig Dispense Refill Last Dose  . omeprazole (PRILOSEC) 40 MG capsule Take 1 capsule (40 mg total) by mouth daily. 30 capsule 1   . ondansetron (ZOFRAN ODT) 4 MG disintegrating tablet Take 1 tablet (4 mg total) by mouth every 8 (eight) hours as needed for nausea or vomiting. 20 tablet 2   . Prenatal MV & Min w/FA-DHA (PRENATAL ADULT GUMMY/DHA/FA PO) Take 1 tablet by mouth daily.         Review of Systems   All systems reviewed and negative except as stated in HPI  Blood  pressure 121/86, pulse 97, temperature 98 F (36.7 C), temperature source Oral, resp. rate 18, height 5\' 2"  (1.575 m), weight 82.1 kg, last menstrual period 02/06/2019. General appearance: alert, cooperative and appears stated age Lungs: clear to auscultation bilaterally Heart: regular rate and rhythm Abdomen: soft, non-tender; bowel sounds normal Extremities: No calf swelling or tenderness Presentation: breech by u/s Fetal monitoring: 135/mod/+a/-d Uterine activity: quiet   External Cephalic Version   After informed verbal consent, Terbutaline 0.25 mg SQ given, ECV was attempted under Ultrasound guidance.  Backward summersault to mother's right, two attempts, successful. FHR was reactive before and after the procedure.   Pt. Tolerated the procedure well. Dr. 04/06/2019 present for procedure.   Prenatal labs: ABO, Rh: --/--/B POS (10/29 1045) Antibody: NEG (10/29 1045) Rubella: 11.10 (04/19 0926) RPR:  NON REACTIVE (10/29 1028)  HBsAg: Negative (04/19 0926)  HIV: Non Reactive (08/16 0914)  GBS: Negative/-- (10/15 1026)  2 hr Glucola: wnl Genetic screening:  wnl Anatomy 11-16-2004: wnl  Prenatal Transfer Tool  Maternal Diabetes: No Genetic Screening: Normal Maternal Ultrasounds/Referrals: LGA Fetal Ultrasounds or other Referrals:  None Maternal Substance Abuse:  No Significant Maternal Medications:  None Significant Maternal Lab Results: Group B Strep negative  Results for orders placed or performed during the hospital encounter of 11/06/19 (from the past 24 hour(s))  Prepare RBC (crossmatch)   Collection Time: 11/06/19  8:44 AM  Result Value Ref Range   Order Confirmation      ORDER PROCESSED BY BLOOD BANK Performed at [facility], [address] [place], [place]     Patient Active Problem List   Diagnosis Date Noted  . Breech presentation 11/06/2019  . Anemia in pregnancy 11/05/2019  . Malpresentation before onset of labor 11/03/2019  . Excessive fetal growth affecting management of mother in third trimester, antepartum 10/20/2019  . Pap smear abnormality of cervix with LGSIL 04/25/2019  . Supervision of high risk pregnancy, antepartum 04/17/2019  . AMA (advanced maternal age) multigravida 40+ 04/17/2019    Assessment: Tracey Hale is a 44 y.o. 45 at [redacted]w[redacted]d here for scheduled section for breech presentation. After discussion of options patient elected to attempt ECV with immediate IOL if successful. ECV performed and was successful. Patient was made aware of ECV risks as well as risks associated with attempted delivery of LGA baby.  #Breech presentation - successful ECV today, binder placed, close monitoring of presentation intra-partum #LGA - 4037 g @ 37+1 with AC >99%. No GDM. Extrapolates to about 4550 g. Hx successful VD of 4054 g child. Will plan to closely monitor active and second stages, lower threshold for c/s if protraction #Anemia - h 8.7, 2 u prbcs  held #Labor: will move to L & D, then cervical exam and decision regarding ripening #Pain: TBD #FWB: Cat 1 #ID:  gbs neg #MOF: bottle #MOC: nexplanon #Circ:  no  Tracey Hale 11/06/2019, 10:02 AM

## 2019-11-07 ENCOUNTER — Inpatient Hospital Stay (HOSPITAL_COMMUNITY): Payer: Medicaid Other | Admitting: Anesthesiology

## 2019-11-07 ENCOUNTER — Encounter (HOSPITAL_COMMUNITY): Payer: Self-pay | Admitting: Obstetrics and Gynecology

## 2019-11-07 DIAGNOSIS — O3663X0 Maternal care for excessive fetal growth, third trimester, not applicable or unspecified: Secondary | ICD-10-CM

## 2019-11-07 DIAGNOSIS — O328XX0 Maternal care for other malpresentation of fetus, not applicable or unspecified: Secondary | ICD-10-CM

## 2019-11-07 DIAGNOSIS — Z3A39 39 weeks gestation of pregnancy: Secondary | ICD-10-CM

## 2019-11-07 LAB — TYPE AND SCREEN
ABO/RH(D): B POS
Antibody Screen: NEGATIVE
Unit division: 0
Unit division: 0

## 2019-11-07 LAB — BPAM RBC
Blood Product Expiration Date: 202111192359
Blood Product Expiration Date: 202111192359
Unit Type and Rh: 7300
Unit Type and Rh: 7300

## 2019-11-07 MED ORDER — SIMETHICONE 80 MG PO CHEW: 80.0000 mg | CHEWABLE_TABLET | ORAL | Status: DC | PRN

## 2019-11-07 MED ORDER — ACETAMINOPHEN 650 MG RE SUPP
650.0000 mg | Freq: Once | RECTAL | Status: AC
  Administered 2019-11-07: 650 mg via RECTAL
  Filled 2019-11-07: qty 1

## 2019-11-07 MED ORDER — SODIUM CHLORIDE 0.9 % IV SOLN
2.0000 g | Freq: Four times a day (QID) | INTRAVENOUS | Status: DC
  Administered 2019-11-07: 2 g via INTRAVENOUS
  Filled 2019-11-07: qty 2000

## 2019-11-07 MED ORDER — COCONUT OIL OIL: 1.0000 "application " | TOPICAL_OIL | Status: DC | PRN

## 2019-11-07 MED ORDER — DIPHENHYDRAMINE HCL 25 MG PO CAPS: 25.0000 mg | ORAL_CAPSULE | Freq: Four times a day (QID) | ORAL | Status: DC | PRN

## 2019-11-07 MED ORDER — SODIUM CHLORIDE (PF) 0.9 % IJ SOLN
INTRAMUSCULAR | Status: DC | PRN
  Administered 2019-11-07: 11 mL/h via EPIDURAL

## 2019-11-07 MED ORDER — WITCH HAZEL-GLYCERIN EX PADS: 1.0000 "application " | MEDICATED_PAD | CUTANEOUS | Status: DC | PRN

## 2019-11-07 MED ORDER — TETANUS-DIPHTH-ACELL PERTUSSIS 5-2.5-18.5 LF-MCG/0.5 IM SUSY: 0.5000 mL | PREFILLED_SYRINGE | Freq: Once | INTRAMUSCULAR | Status: DC

## 2019-11-07 MED ORDER — IBUPROFEN 600 MG PO TABS
600.0000 mg | ORAL_TABLET | Freq: Four times a day (QID) | ORAL | Status: DC
  Administered 2019-11-07 – 2019-11-09 (×8): 600 mg via ORAL
  Filled 2019-11-07 (×9): qty 1

## 2019-11-07 MED ORDER — PRENATAL MULTIVITAMIN CH
1.0000 | ORAL_TABLET | Freq: Every day | ORAL | Status: DC
  Administered 2019-11-08 – 2019-11-09 (×2): 1 via ORAL
  Filled 2019-11-07 (×2): qty 1

## 2019-11-07 MED ORDER — ONDANSETRON HCL 4 MG/2ML IJ SOLN: 4.0000 mg | INTRAMUSCULAR | Status: DC | PRN

## 2019-11-07 MED ORDER — BENZOCAINE-MENTHOL 20-0.5 % EX AERO
1.0000 "application " | INHALATION_SPRAY | CUTANEOUS | Status: DC | PRN
  Filled 2019-11-07: qty 56

## 2019-11-07 MED ORDER — SENNOSIDES-DOCUSATE SODIUM 8.6-50 MG PO TABS
2.0000 | ORAL_TABLET | ORAL | Status: DC
  Administered 2019-11-08: 2 via ORAL
  Filled 2019-11-07 (×2): qty 2

## 2019-11-07 MED ORDER — ONDANSETRON HCL 4 MG PO TABS: 4.0000 mg | ORAL_TABLET | ORAL | Status: DC | PRN

## 2019-11-07 MED ORDER — GENTAMICIN SULFATE 40 MG/ML IJ SOLN
5.0000 mg/kg | INTRAVENOUS | Status: DC
  Administered 2019-11-07: 310 mg via INTRAVENOUS
  Filled 2019-11-07: qty 7.75

## 2019-11-07 MED ORDER — MEASLES, MUMPS & RUBELLA VAC IJ SOLR: 0.5000 mL | Freq: Once | INTRAMUSCULAR | Status: DC

## 2019-11-07 MED ORDER — LIDOCAINE HCL (PF) 1 % IJ SOLN
INTRAMUSCULAR | Status: DC | PRN
  Administered 2019-11-07: 3 mL via EPIDURAL
  Administered 2019-11-07: 4 mL via EPIDURAL

## 2019-11-07 MED ORDER — ACETAMINOPHEN 325 MG PO TABS: 650.0000 mg | ORAL_TABLET | ORAL | Status: DC | PRN

## 2019-11-07 MED ORDER — DIBUCAINE (PERIANAL) 1 % EX OINT: 1.0000 "application " | TOPICAL_OINTMENT | CUTANEOUS | Status: DC | PRN

## 2019-11-07 NOTE — Progress Notes (Signed)
Labor Progress Note Tracey Hale is a 44 y.o. [id number] at [redacted]w[redacted]d presented for IOL for LGA. S: Laying comfortably in bed, no concerns   O:  BP 109/72    Pulse 63    Temp 97.8 F (36.6 C) (Oral)    Resp 18    Ht 5\' 2"  (1.575 m)    Wt 82.1 kg    LMP 02/06/2019    SpO2 99%    BMI 33.11 kg/m  EFM: 130/moderate variability/pos accels/neg decels   CVE: Dilation: 5.5 Effacement (%): 50 Station: -2 Presentation: Vertex (confirmed via bedside u/s) Exam by:: [email or web address], CNM    A&P: 44 y.o. 45 [redacted]w[redacted]d here for IOL for LGA s/p successful ECV   #Induction of Labor: S/p Cytotec x2. FB 1515 and out around 2045. Continues to contract q2 min. Start pitocin  #Pain: epidural effective  #FWB: cat I  #GBS negative   #LGA: 4037 g @ 37+1 with AC >99%. No GDM. Extrapolates to about 4550 g. Hx successful VD of 4054 g child. SD precautions at delivery.   2046, MD 2:21 AM

## 2019-11-07 NOTE — Anesthesia Procedure Notes (Signed)
Epidural Patient location during procedure: OB Start time: 11/07/2019 12:13 AM End time: 11/07/2019 12:20 AM  Staffing Anesthesiologist: Mal Amabile, MD Performed: anesthesiologist   Preanesthetic Checklist Completed: patient identified, IV checked, site marked, risks and benefits discussed, surgical consent, monitors and equipment checked, pre-op evaluation and timeout performed  Epidural Patient position: sitting Prep: DuraPrep and site prepped and draped Patient monitoring: continuous pulse ox and blood pressure Approach: midline Location: L3-L4 Injection technique: LOR air  Needle:  Needle type: Tuohy  Needle gauge: 17 G Needle length: 9 cm and 9 Needle insertion depth: 6 cm Catheter type: closed end flexible Catheter size: 19 Gauge Catheter at skin depth: 11 cm Test dose: negative and Other  Assessment Events: blood not aspirated, injection not painful, no injection resistance, no paresthesia and negative IV test  Additional Notes Patient identified. Risks and benefits discussed including failed block, incomplete  Pain control, post dural puncture headache, nerve damage, paralysis, blood pressure Changes, nausea, vomiting, reactions to medications-both toxic and allergic and post Partum back pain. All questions were answered. Patient expressed understanding and wished to proceed. Sterile technique was used throughout procedure. Epidural site was Dressed with sterile barrier dressing. No paresthesias, signs of intravascular injection Or signs of intrathecal spread were encountered.  Patient was more comfortable after the epidural was dosed. Please see RN's note for documentation of vital signs and FHR which are stable. Reason for block:procedure for pain

## 2019-11-07 NOTE — Progress Notes (Signed)
Labor Progress Note Tracey Hale is a 44 y.o. [id number] at [redacted]w[redacted]d presented for IOL for LGA. S: Laying comfortably in bed, no concerns   O:  BP 125/83   Pulse 66   Temp 97.8 F (36.6 C) (Oral)   Resp 18   Ht 5\' 2"  (1.575 m)   Wt 82.1 kg   LMP 02/06/2019   SpO2 99%   BMI 33.11 kg/m  EFM: 140/moderate variability/pos accels/neg decels   CVE: Dilation: 7.5 Effacement (%): 90 Station: -1 Presentation: Vertex Exam by:: [email or web address]    A&P: 44 y.o. 45 [redacted]w[redacted]d here for IOL for LGA s/p successful ECV   #Induction of Labor: S/p Cytotec x2. FB 1515 and out around 2045. AROM/FSE around 4:40 Continues to contract q2 min. Continue pitocin.   #Pain: epidural effective  #FWB: cat I  #GBS negative   #LGA: 4037 g @ 37+1 with AC >99%. No GDM. Extrapolates to about 4550 g. Hx successful VD of 4054 g child. SD precautions at delivery.   2046, MD 5:09 AM

## 2019-11-07 NOTE — Progress Notes (Addendum)
Labor Progress Note Tracey Hale is a 44 y.o. [id number] at [redacted]w[redacted]d presented for primary CS d/t breech then had successful ECV and IOL started. Suspected LGA of 4000g (2 wks ago) with pelvis proven to 4054g.  S:  Comfortable with epidural, "feels numb".   O:  BP 134/80   Pulse 86   Temp 99 F (37.2 C) (Axillary)   Resp 18   Ht 5\' 2"  (1.575 m)   Wt 82.1 kg   LMP 02/06/2019   SpO2 100%   BMI 33.11 kg/m  EFM: baseline 155 bpm/ mod variability/ + accels/ no decels  Toco/IUPC: q2/200+ MVU SVE: Dilation: (P) 10 Dilation Complete Date: (P) 11/07/19 Dilation Complete Time: (P) 1044 Effacement (%): 70 Station: (P) 0, Plus 1 Presentation: (P) Vertex Exam by:: (P) Tracey Hale, CNM  +caput LOP  Pitocin: 6 mu/min  A/P: 44 y.o. 45 [redacted]w[redacted]d  1. Labor: second stage 2. FWB: Cat I 3. Pain: epidural  Will start active pushing. Anticipate SVD.  [redacted]w[redacted]d, CNM 11:02 AM

## 2019-11-07 NOTE — Progress Notes (Signed)
Labor Progress Note Actively pushing for last 45 min  S:  Comfortable with epidural.   O:  BP 139/80   Pulse 81   Temp (!) 101.3 F (38.5 C) (Axillary)   Resp 18   Ht 5\' 2"  (1.575 m)   Wt 82.1 kg   LMP 02/06/2019   SpO2 100%   BMI 33.11 kg/m  EFM: baseline 160 bpm/ min-mod variability/ no accels/ variable decels  Toco/IUPC: q2 SVE:10/+2 Pitocin: off  A/P: 44 y.o. 45 [redacted]w[redacted]d  1. Labor: second stage 2. FWB: Cat II 3. Pain: epidural 4. Triple I  Variables with pushing, pt repositioned into left and right lateral with little improvement. Pitocin d/c'd. Now with maternal fever and fetal tachycardia, will start Amp and Gent and give Tylenol. Continue active pushing. Anticipate SVD. Dr. [redacted]w[redacted]d updated.  Adrian Blackwater, CNM 12:02 PM

## 2019-11-07 NOTE — Progress Notes (Signed)
FHR 150's w/moderate variability; late decels.  IUPC placed by Sarita Bottom, MD.  Pt placed in high fowler's, decels resolved.  cx 9/ant lip 70/0/ OP.  Will try different positions/peanut ball when able to help facilitate rotation.

## 2019-11-07 NOTE — Discharge Summary (Addendum)
Postpartum Discharge Summary  Date of Service updated 11/09/19     Patient Name: Tracey Hale DOB: 25-Aug-1975 MRN: [id number]  Date of admission: 11/06/2019 Delivery date:11/07/2019  Delivering provider: Truett Mainland  Date of discharge: 11/09/2019  Admitting diagnosis: Breech presentation [O32.1XX0] Term pregnancy [Z34.90] Intrauterine pregnancy: [redacted]w[redacted]d    Secondary diagnosis:  Active Problems:   Breech presentation   Term pregnancy   Vacuum-assisted vaginal delivery   Additional problems: none    Discharge diagnosis: Term Pregnancy Delivered                                              Post partum procedures: nexplanon Augmentation: AROM, Pitocin, Cytotec and IP Foley Complications: None  Hospital course: Induction of Labor With Vaginal Delivery   44y.o. yo [id number] admitted to the hospital 11/06/2019 for a scheduled primary cesarean section for breech presentation. She elected to undergo ECV which was successful and subsequently an induction of labor was started. Indication for induction: Unstable lie and suspected LGA .  Patient had an uncomplicated labor course as follows: Membrane Rupture Time/Date: 4:38 AM ,11/07/2019   Delivery Method:Vaginal, Vacuum (Extractor)  Episiotomy: None  Lacerations:  2nd degree;Perineal  Details of delivery can be found in separate delivery note.  Patient had a routine postpartum course. She was started on norvasc 42m40mor elevated blood pressures and will have a blood pressure check as an outpatient. Patient is discharged home 11/09/19.  Newborn Data: Birth date:11/07/2019  Birth time:1:01 PM  Gender:Female  Living status:Living  Apgars:1 ,8  Weight:3935 g   Magnesium Sulfate received: No BMZ received: No Rhophylac:N/A MMR:N/A T-DaP:Given prenatally Flu: No Transfusion: Venofer  Physical exam  Vitals:   11/08/19 2056 11/09/19 0554 11/09/19 1016 11/09/19 1158  BP: 114/76 130/85 135/77 134/89  Pulse: 77 60 84 87  Resp:  _0 Temp: 98.2 F (36.8 C) 97.7 F (36.5 C) 98.2 F (36.8 C) 98.5 F (36.9 C)  TempSrc: Oral Axillary Axillary Axillary  SpO2: 100%  100% 100%  Weight:      Height:       General: alert and no distress Lochia: appropriate Uterine Fundus: firm Incision: N/A DVT Evaluation: No evidence of DVT seen on physical exam. No cords or calf tenderness. No significant calf/ankle edema. Labs: Lab Results  Component Value Date   WBC 27.4 (H) 11/08/2019   HGB 7.6 (L) 11/08/2019   HCT 25.1 (L) 11/08/2019   MCV 77.0 (L) 11/08/2019   PLT 239 11/08/2019   CMP Latest Ref Rng & Units 09/21/2019  Glucose 65 - 99 mg/dL 111(H)  BUN 6 - 24 mg/dL 6  Creatinine 0.57 - 1.00 mg/dL 0.67  Sodium 134 - 144 mmol/L 136  Potassium 3.5 - 5.2 mmol/L 3.9  Chloride 96 - 106 mmol/L 103  CO2 20 - 29 mmol/L 19(L)  Calcium 8.7 - 10.2 mg/dL 8.9  Total Protein 6.0 - 8.5 g/dL 6.7  Total Bilirubin 0.0 - 1.2 mg/dL <0.2  Alkaline Phos 44 - 121 IU/L 100  AST 0 - 40 IU/L 16  ALT 0 - 32 IU/L 9   Edinburgh Score: Edinburgh Postnatal Depression Scale Screening Tool 11/08/2019  I have been able to laugh and see the funny side of things. 0  I have looked forward with enjoyment to things. 0  I have blamed  myself unnecessarily when things went wrong. 0  I have been anxious or worried for no good reason. 0  I have felt scared or panicky for no good reason. 0  Things have been getting on top of me. 0  I have been so unhappy that I have had difficulty sleeping. 0  I have felt sad or miserable. 0  I have been so unhappy that I have been crying. 0  The thought of harming myself has occurred to me. 0  Edinburgh Postnatal Depression Scale Total 0     After visit meds:  Allergies as of 11/09/2019   No Known Allergies     Medication List    STOP taking these medications   omeprazole 40 MG capsule Commonly known as: PRILOSEC   ondansetron 4 MG disintegrating tablet Commonly known as: Zofran ODT   PRENATAL  ADULT GUMMY/DHA/FA PO     TAKE these medications   acetaminophen 325 MG tablet Commonly known as: Tylenol Take 2 tablets (650 mg total) by mouth every 4 (four) hours as needed (for pain scale < 4).   amLODipine 5 MG tablet Commonly known as: NORVASC Take 1 tablet (5 mg total) by mouth daily.   ibuprofen 600 MG tablet Commonly known as: ADVIL Take 1 tablet (600 mg total) by mouth every 6 (six) hours.       Discharge home in stable condition Infant Feeding: Breast Infant Disposition:home with mother Discharge instruction: per After Visit Summary and Postpartum booklet. Activity: Advance as tolerated. Pelvic rest for 6 weeks.  Diet: routine diet Future Appointments: Future Appointments  Date Time Provider [place]  11/13/2019 10:00 AM [facility] NURSE [facility]  12/12/2019  9:55 AM [place], Rona Ravens, NP [facility]   Follow up Visit:  Message sent to [facility] to schedule 1 week BP check.  Please schedule this patient for a Virtual postpartum visit in 6 weeks with the following provider: Any provider. Additional Postpartum F/U: 1 week BP check given slightly elevated Bps s/p delivery Low risk pregnancy complicated by:  n/a Delivery mode:  Vaginal, Vacuum (Extractor)  Anticipated Birth Control:  PP Nexplanon placed  11/09/2019 Janet Berlin, MD

## 2019-11-07 NOTE — Anesthesia Preprocedure Evaluation (Signed)
Anesthesia Evaluation  Patient identified by MRN, date of birth, ID band Patient awake    Reviewed: Allergy & Precautions, Patient's Chart, lab work & pertinent test results  Airway Mallampati: II  TM Distance: >3 FB Neck ROM: Full    Dental no notable dental hx. (+) Teeth Intact   Pulmonary neg pulmonary ROS,    Pulmonary exam normal breath sounds clear to auscultation       Cardiovascular negative cardio ROS Normal cardiovascular exam Rhythm:Regular Rate:Normal     Neuro/Psych negative neurological ROS  negative psych ROS   GI/Hepatic Neg liver ROS, GERD  Medicated and Controlled,  Endo/Other  Obesity  Renal/GU negative Renal ROS  negative genitourinary   Musculoskeletal negative musculoskeletal ROS (+)   Abdominal (+) + obese,   Peds  Hematology  (+) anemia ,   Anesthesia Other Findings   Reproductive/Obstetrics (+) Pregnancy Breech presentation with successful version AMA                             Anesthesia Physical Anesthesia Plan  ASA: II  Anesthesia Plan: Epidural   Post-op Pain Management:    Induction:   PONV Risk Score and Plan:   Airway Management Planned: Natural Airway  Additional Equipment:   Intra-op Plan:   Post-operative Plan:   Informed Consent: I have reviewed the patients History and Physical, chart, labs and discussed the procedure including the risks, benefits and alternatives for the proposed anesthesia with the patient or authorized representative who has indicated his/her understanding and acceptance.       Plan Discussed with: Anesthesiologist  Anesthesia Plan Comments:         Anesthesia Quick Evaluation

## 2019-11-07 NOTE — Consult Note (Signed)
ANTIBIOTIC CONSULT NOTE - INITIAL  Pharmacy Consult for Gentamicin Indication: Chorioamnionitis   No Known Allergies  Patient Measurements: Height: 5\' 2"  (157.5 cm) Weight: 82.1 kg (181 lb) IBW/kg (Calculated) : 50.1 Adjusted Body Weight: 62.9kg  Vital Signs: Temp: 101.3 F (38.5 C) (11/02 1155) Temp Source: Axillary (11/02 1155) BP: 139/80 (11/02 1201) Pulse Rate: 81 (11/02 1201)  Labs: Recent Labs    11/06/19 1039  WBC 11.4*  HGB 8.0*  PLT 241   No results for input(s): GENTTROUGH, GENTPEAK, GENTRANDOM in the last 72 hours.   Microbiology: Recent Results (from the past 720 hour(s))  Culture, beta strep (group b only)     Status: None   Collection Time: 10/20/19 10:26 AM   Specimen: Vaginal/Rectal; Genital   VR  Result Value Ref Range Status   Strep Gp B Culture Negative Negative Final    Comment: Centers for Disease Control and Prevention (CDC) and American Congress of Obstetricians and Gynecologists (ACOG) guidelines for prevention of perinatal group B streptococcal (GBS) disease specify co-collection of a vaginal and rectal swab specimen to maximize sensitivity of GBS detection. Per the CDC and ACOG, swabbing both the lower vagina and rectum substantially increases the yield of detection compared with sampling the vagina alone. Penicillin G, ampicillin, or cefazolin are indicated for intrapartum prophylaxis of perinatal GBS colonization. Reflex susceptibility testing should be performed prior to use of clindamycin only on GBS isolates from penicillin-allergic women who are considered a high risk for anaphylaxis. Treatment with vancomycin without additional testing is warranted if resistance to clindamycin is noted.   SARS CORONAVIRUS 2 (TAT 6-24 HRS) Nasopharyngeal Nasopharyngeal Swab     Status: None   Collection Time: 11/03/19 11:47 AM   Specimen: Nasopharyngeal Swab  Result Value Ref Range Status   SARS Coronavirus 2 NEGATIVE NEGATIVE Final    Comment:  (NOTE) SARS-CoV-2 target nucleic acids are NOT DETECTED.  The SARS-CoV-2 RNA is generally detectable in upper and lower respiratory specimens during the acute phase of infection. Negative results do not preclude SARS-CoV-2 infection, do not rule out co-infections with other pathogens, and should not be used as the sole basis for treatment or other patient management decisions. Negative results must be combined with clinical observations, patient history, and epidemiological information. The expected result is Negative.  Fact Sheet for Patients: 11/05/19  Fact Sheet for Healthcare Providers: HairSlick.no  This test is not yet approved or cleared by the quierodirigir.com FDA and  has been authorized for detection and/or diagnosis of SARS-CoV-2 by FDA under an Emergency Use Authorization (EUA). This EUA will remain  in effect (meaning this test can be used) for the duration of the COVID-19 declaration under Se ction 564(b)(1) of the Act, 21 U.S.C. section 360bbb-3(b)(1), unless the authorization is terminated or revoked sooner.  Performed at [facility], [address], [place], [place]     Medications:  Ampicillin 2g IV Q6hrs Gentamicin 5mg /kg (310mg ) IV Q24hrs  Goal of Therapy:  Gentamicin peak 6-8 mg/L and Trough < 1 mg/L  Plan:  Gentamicin 310 mg IV every 24 hrs  Check Scr with next labs if gentamicin continued. Will check gentamicin levels if continued > 72hr or clinically indicated.  [id number] Tracey Hale 11/07/2019,12:15 PM

## 2019-11-08 ENCOUNTER — Encounter (HOSPITAL_COMMUNITY): Payer: Self-pay | Admitting: Obstetrics and Gynecology

## 2019-11-08 DIAGNOSIS — Z30017 Encounter for initial prescription of implantable subdermal contraceptive: Secondary | ICD-10-CM

## 2019-11-08 LAB — CBC
HCT: 25.1 % — ABNORMAL LOW (ref 36.0–46.0)
Hemoglobin: 7.6 g/dL — ABNORMAL LOW (ref 12.0–15.0)
MCH: 23.3 pg — ABNORMAL LOW (ref 26.0–34.0)
MCHC: 30.3 g/dL (ref 30.0–36.0)
MCV: 77 fL — ABNORMAL LOW (ref 80.0–100.0)
Platelets: 239 10*3/uL (ref 150–400)
RBC: 3.26 MIL/uL — ABNORMAL LOW (ref 3.87–5.11)
RDW: 16.8 % — ABNORMAL HIGH (ref 11.5–15.5)
WBC: 27.4 10*3/uL — ABNORMAL HIGH (ref 4.0–10.5)
nRBC: 0.1 % (ref 0.0–0.2)

## 2019-11-08 MED ORDER — ETONOGESTREL 68 MG ~~LOC~~ IMPL
68.0000 mg | DRUG_IMPLANT | Freq: Once | SUBCUTANEOUS | Status: AC
  Administered 2019-11-08: 68 mg via SUBCUTANEOUS
  Filled 2019-11-08: qty 1

## 2019-11-08 MED ORDER — LIDOCAINE HCL 1 % IJ SOLN
0.0000 mL | Freq: Once | INTRAMUSCULAR | Status: AC | PRN
  Administered 2019-11-08: 20 mL via INTRADERMAL

## 2019-11-08 MED ORDER — LIDOCAINE HCL 1 % IJ SOLN
INTRAMUSCULAR | Status: AC
  Filled 2019-11-08: qty 20

## 2019-11-08 MED ORDER — SODIUM CHLORIDE 0.9 % IV SOLN
510.0000 mg | Freq: Once | INTRAVENOUS | Status: DC
Start: 2019-11-08 — End: 2019-11-08

## 2019-11-08 MED ORDER — INFLUENZA VAC SPLIT QUAD 0.5 ML IM SUSY
0.5000 mL | PREFILLED_SYRINGE | INTRAMUSCULAR | Status: AC
  Administered 2019-11-08: 0.5 mL via INTRAMUSCULAR
  Filled 2019-11-08: qty 0.5

## 2019-11-08 MED ORDER — SODIUM CHLORIDE 0.9 % IV SOLN
500.0000 mg | Freq: Once | INTRAVENOUS | Status: AC
  Administered 2019-11-08: 500 mg via INTRAVENOUS
  Filled 2019-11-08: qty 25

## 2019-11-08 NOTE — Procedures (Signed)
Post-Placental Nexplanon Insertion Procedure Note  Patient was identified. Informed consent was signed, signed copy in chart. A time-out was performed.    The insertion site was identified 8-10 cm (3-4 inches) from the medial epicondyle of the humerus and 3-5 cm (1.25-2 inches) posterior to (below) the sulcus (groove) between the biceps and triceps muscles of the patient's right arm and marked. The site was prepped and draped in the usual sterile fashion. Pt was prepped with alcohol swab and then injected with 4.5 cc of 1% lidocaine. The site was prepped with betadine. Nexplanon removed form packaging,  Device confirmed in needle, then inserted full length of needle and withdrawn per handbook instructions. Provider and patient verified presence of the implant in the woman's arm by palpation. Pt insertion site was covered with steristrips/adhesive bandage and pressure bandage. There was minimal blood loss. Patient tolerated procedure well.  Patient was given post procedure instructions and Nexplanon user card with expiration date. Condoms were recommended for STI prevention. Patient was asked to keep the pressure dressing on for 24 hours to minimize bruising and keep the adhesive bandage on for 3-5 days. The patient verbalized understanding of the plan of care and agrees.   Lot # [id number] Expiration Date03/30/2024

## 2019-11-08 NOTE — Progress Notes (Signed)
Post Partum Day 1 Subjective: no complaints, up ad lib, voiding and tolerating PO  Objective: Blood pressure 108/65, pulse 75, temperature 98 F (36.7 C), temperature source Oral, resp. rate 16, height 5\' 2"  (1.575 m), weight 82.1 kg, last menstrual period 02/06/2019, SpO2 98 %, unknown if currently breastfeeding.  Physical Exam:  General: alert, cooperative and no distress Lochia: appropriate Uterine Fundus: firm Incision: n/a DVT Evaluation: No evidence of DVT seen on physical exam.  Recent Labs    11/06/19 1039  HGB 8.0*  HCT 26.8*    Assessment/Plan: Plan for discharge tomorrow   LOS: 2 days   13/01/21 11/08/2019, 7:21 AM

## 2019-11-08 NOTE — Anesthesia Postprocedure Evaluation (Signed)
Anesthesia Post Note  Patient: Tracey Hale  Procedure(s) Performed: AN AD HOC LABOR EPIDURAL     Patient location during evaluation: Mother Baby Anesthesia Type: Epidural Level of consciousness: awake and alert and patient cooperative Pain management: satisfactory to patient Vital Signs Assessment: post-procedure vital signs reviewed and stable Respiratory status: spontaneous breathing Cardiovascular status: stable Postop Assessment: no apparent nausea or vomiting Anesthetic complications: no   No complications documented.  Last Vitals:  Vitals:   11/08/19 0027 11/08/19 0440  BP: 99/72 108/65  Pulse: 73 75  Resp: 16 16  Temp: 36.8 C 36.7 C  SpO2: 98% 98%    Last Pain:  Vitals:   11/08/19 0440  TempSrc: Oral  PainSc: 0-No pain   Pain Goal:                   Minerva Areola

## 2019-11-08 NOTE — Progress Notes (Addendum)
POSTPARTUM PROGRESS NOTE  Post Partum Day 1  Subjective:  Tracey Hale is a 44 y.o. [id number] s/p SVD at [redacted]w[redacted]d.  No acute events overnight.  Pt denies problems with ambulating, voiding or po intake.  She denies nausea or vomiting.  Pain is well controlled.  She has had flatus. She has not had bowel movement.  Lochia Moderate. Denies fever or chills.   Objective: Blood pressure 108/65, pulse 75, temperature 98 F (36.7 C), temperature source Oral, resp. rate 16, height 5\' 2"  (1.575 m), weight 82.1 kg, last menstrual period 02/06/2019, SpO2 98 %, unknown if currently breastfeeding.  Physical Exam:  General: alert, cooperative and no distress Chest: no respiratory distress Heart:regular rate, distal pulses intact Abdomen: soft, nontender,  Uterine Fundus: firm, appropriately tender DVT Evaluation: No calf swelling or tenderness Extremities: 1+ pitting edema Skin: warm, dry   Recent Labs    11/06/19 1039  HGB 8.0*  HCT 26.8*    Assessment/Plan: Carrie Usery is a 44 y.o. 45 s/p SVD at [redacted]w[redacted]d   PPD#2 - Doing well Contraception: s/p nexplanon Feeding: Breast Dispo: Plan for discharge tomorrow.    LOS: 2 days   [redacted]w[redacted]d, Medical Student, 11/08/2019, 7:07 AM

## 2019-11-09 ENCOUNTER — Other Ambulatory Visit (HOSPITAL_COMMUNITY): Payer: Self-pay | Admitting: Obstetrics and Gynecology

## 2019-11-09 LAB — SURGICAL PATHOLOGY

## 2019-11-09 MED ORDER — IBUPROFEN 600 MG PO TABS
600.0000 mg | ORAL_TABLET | Freq: Four times a day (QID) | ORAL | 0 refills | Status: DC
Start: 2019-11-09 — End: 2019-11-21

## 2019-11-09 MED ORDER — AMLODIPINE BESYLATE 5 MG PO TABS: 5.0000 mg | ORAL_TABLET | Freq: Every day | ORAL | 0 refills | Status: DC

## 2019-11-09 MED ORDER — ACETAMINOPHEN 325 MG PO TABS
650.0000 mg | ORAL_TABLET | ORAL | 0 refills | Status: DC | PRN
Start: 2019-11-09 — End: 2019-11-21

## 2019-11-09 MED FILL — AMLODIPINE BESYLATE 5 MG TA: 5 | 30 days supply | Qty: 30 | Fill #0

## 2019-11-09 MED FILL — IBUPROFEN 600 MG TABLET: 600 | 3 days supply | Qty: 15 | Fill #0

## 2019-11-09 MED FILL — ACETAMINOPHEN 325 MG TABS: 325 | 3 days supply | Qty: 15 | Fill #0

## 2019-11-09 NOTE — Lactation Note (Addendum)
This note was copied from a baby's chart. Lactation Consultation Note  Patient Name: Tracey Hale [id number] Date: 11/09/2019 Reason for consult: Follow-up assessment   Mother is a P3, infant is 25 hours old and has not had a stool.  .  Mother reports that infant is feeding well with breastfeeding and formula Reviewed hand expression with mother. Observed large drops of colostrum.  Mother was observed with infant latched on at the left breast. Observed infant suckling with audible swallows. Infant sustained latch for 15 mins.  Discussed treatment and prevention of engorgement.  Plan of Care : Breastfeed infant with feeding cues Supplement infant with ebm/formula, according to supplemental guidelines. Pump using hand pump after each feeding for 15-20 mins.   Mother to continue to cue base feed infant and feed at least 8-12 times or more in 24 hours and advised to allow for cluster feeding infant as needed.  Mother to continue to due STS. Mother is aware of available LC services at [facility], BFSG'S, OP Dept, and phone # for questions or concerns about breastfeeding.  Mother receptive to all teaching and plan of care.     Maternal Data    Feeding Feeding Type: Breast Fed  LATCH Score Latch: Grasps breast easily, tongue down, lips flanged, rhythmical sucking.  Audible Swallowing: Spontaneous and intermittent  Type of Nipple: Everted at rest and after stimulation  Comfort (Breast/Nipple): Soft / non-tender  Hold (Positioning): Assistance needed to correctly position infant at breast and maintain latch.  LATCH Score: 9  Interventions Interventions: Assisted with latch;Skin to skin;Hand express;Breast compression;Adjust position;Support pillows;Position options;Hand pump  Lactation Tools Discussed/Used     Consult Status Consult Status: Follow-up Date: 11/10/19 Follow-up type: In-patient    Stevan Born [facility] 11/09/2019, 9:31 AM

## 2019-11-09 NOTE — Lactation Note (Signed)
This note was copied from a baby's chart. 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  Infant is now  cluster feeding and not sustaining latch while feeding very fussy. Per mom, she BF her other two children for only 3 months, her 2nd child is now 43 years old. Mom is active on the [facility] program in [facility] and she doesn't have DEBP at home. LC explained she has hand pump in her DEBP kit. LC entered room mom has formula on the counter mom will supplement infant at the breast using 5 F french tube. Mom latched infant on her right breast using the football hold position and 5 F french tube, infant latched with depth and swallows heard, infant BF for 22 minutes and consumed 20 mls of formula ( Gerber with iron ) 20 kcal while at  the  breast. Infant appeared content after the feeding, burped and passing gas. Mom started using the DEBP as LC was leaving the room. Mom knows to pump every 3 hours for 15 minutes on initial setting to help stimulate and establish her milk supply. Mom will continue to BF infant by cues, 8 to 12+ times within 24 hours, STS and supplementing at the breast with 5 F french feeding tube. Mom knows to call RN or LC if she needs assistance with latching infant at the breast.  LC suggested mom attend the [facility] BF support group once discharge from the hospital within the community. Mom made aware of O/P services, breastfeeding support groups, community resources, and our phone # for post-discharge questions.  Maternal Data Formula Feeding for Exclusion: No Has patient been taught Hand Expression?: Yes Does the patient have breastfeeding experience prior to this delivery?:  Yes  Feeding Feeding Type: Breast Fed  LATCH Score Latch: Grasps breast easily, tongue down, lips flanged, rhythmical sucking.  Audible Swallowing: Spontaneous and intermittent  Type of Nipple: Everted at rest and after stimulation  Comfort (Breast/Nipple): Soft / non-tender  Hold (Positioning): Assistance needed to correctly position infant at breast and maintain latch.  LATCH Score: 9  Interventions Interventions: Breast feeding basics reviewed;Breast compression;Assisted with latch;Adjust position;DEBP;Support pillows;Skin to skin;Hand pump;Breast massage;Position options;Hand express;Expressed milk  Lactation Tools Discussed/Used Tools: 39F feeding tube / Syringe WIC Program: Yes Pump Review: Setup, frequency, and cleaning;Milk Storage Initiated by:: RN Date initiated:: 11/09/19   Consult Status Consult Status: Follow-up Date: 11/09/19 Follow-up type: In-patient    Vicente Serene 11/09/2019, 2:41 AM

## 2019-11-10 ENCOUNTER — Ambulatory Visit: Payer: Self-pay

## 2019-11-10 NOTE — Addendum Note (Signed)
Addendum  created 11/10/19 0927 by Atilano Median, DO   Attestation recorded in Intraprocedure, Intraprocedure Attestations filed

## 2019-11-10 NOTE — Lactation Note (Signed)
This note was copied from a baby's chart. Lactation Consultation Note  Patient Name: Tracey Hale [id number] Date: 11/10/2019 Reason for consult: Follow-up assessment   P3 mother whose infant is now 40 hours old.  This is a term baby at 39+1 weeks. Mother breast fed her other two children for 3 months each.  Mother's feeding preference is breast/bottle.  Family had desired a discharge yesterday, however, pediatrician requested another night stay due to baby not passing stool for 44 hours.  Baby had another stool last night.  Family is dressed and had baby in car seat when I arrived; very ready to be discharged and father asked if the papers were "ready."  I informed him that I would follow up with the RN.  Mother had no questions/concerns related to breast feeding.  Engorgement reviewed yesterday by LC.  Mother is a [facility] participant and does not have a DEBP for home use.  She will contact WIC as desired.  RN updated.    Maternal Data    Feeding    LATCH Score                   Interventions    Lactation Tools Discussed/Used     Consult Status Consult Status: Complete Date: 11/10/19 Follow-up type: Call as needed    Fin Hupp R Armonie Staten 11/10/2019, 8:00 AM

## 2019-11-13 ENCOUNTER — Encounter: Payer: Self-pay | Admitting: *Deleted

## 2019-11-13 ENCOUNTER — Ambulatory Visit (INDEPENDENT_AMBULATORY_CARE_PROVIDER_SITE_OTHER): Payer: Medicaid Other | Admitting: *Deleted

## 2019-11-13 ENCOUNTER — Other Ambulatory Visit: Payer: Self-pay

## 2019-11-13 VITALS — BP 128/85 | HR 88 | Ht 62.0 in | Wt 163.0 lb

## 2019-11-13 DIAGNOSIS — Z0130 Encounter for examination of blood pressure without abnormal findings: Secondary | ICD-10-CM

## 2019-11-13 NOTE — Progress Notes (Signed)
Patient was assessed and managed by nursing staff during this encounter. I have reviewed the chart and agree with the documentation and plan. I have also made any necessary editorial changes.  Kemet Nijjar, MD 11/13/2019 11:47 AM 

## 2019-11-13 NOTE — Progress Notes (Signed)
Pt presents for blood pressure check following vaginal delivery on 11/2. She was started on Amlodipine 5 mg during PP care. I advised pt to continue taking this medication until her PP visit when she will receive further instructions. She denies H/A or visual disturbances and states she is feeling well. BP - 128/85, P - 88. Pt has Mychart video visit for PP visit scheduled on 12/7. Pt voiced understanding of information and instructions given.

## 2019-11-21 ENCOUNTER — Encounter: Payer: Self-pay | Admitting: Nurse Practitioner

## 2019-11-21 ENCOUNTER — Other Ambulatory Visit: Payer: Self-pay

## 2019-11-21 ENCOUNTER — Ambulatory Visit (INDEPENDENT_AMBULATORY_CARE_PROVIDER_SITE_OTHER): Payer: Medicaid Other | Admitting: Nurse Practitioner

## 2019-11-21 DIAGNOSIS — R87810 Cervical high risk human papillomavirus (HPV) DNA test positive: Secondary | ICD-10-CM

## 2019-11-21 DIAGNOSIS — R87612 Low grade squamous intraepithelial lesion on cytologic smear of cervix (LGSIL): Secondary | ICD-10-CM

## 2019-11-21 DIAGNOSIS — N393 Stress incontinence (female) (male): Secondary | ICD-10-CM

## 2019-11-21 DIAGNOSIS — Z8759 Personal history of other complications of pregnancy, childbirth and the puerperium: Secondary | ICD-10-CM

## 2019-11-21 DIAGNOSIS — K5900 Constipation, unspecified: Secondary | ICD-10-CM

## 2019-11-21 DIAGNOSIS — D649 Anemia, unspecified: Secondary | ICD-10-CM

## 2019-11-21 NOTE — Progress Notes (Signed)
Pt states is having a lot of Constipation & nothing is working.

## 2019-11-21 NOTE — Patient Instructions (Addendum)
Ducolax suppository - insert and wait 20 minutes before having a bowel movement Miralax daily for soft stools every day    Constipation, Adult Constipation is when a person:  Poops (has a bowel movement) fewer times in a week than normal.  Has a hard time pooping.  Has poop that is dry, hard, or bigger than normal. Follow these instructions at home: Eating and drinking   Eat foods that have a lot of fiber, such as: ? Fresh fruits and vegetables. ? Whole grains. ? Beans.  Eat less of foods that are high in fat, low in fiber, or overly processed, such as: ? [place] fries. ? Hamburgers. ? Cookies. ? Candy. ? Soda.  Drink enough fluid to keep your pee (urine) clear or pale yellow. General instructions  Exercise regularly or as told by your doctor.  Go to the restroom when you feel like you need to poop. Do not hold it in.  Take over-the-counter and prescription medicines only as told by your doctor. These include any fiber supplements.  Do pelvic floor retraining exercises, such as: ? Doing deep breathing while relaxing your lower belly (abdomen). ? Relaxing your pelvic floor while pooping.  Watch your condition for any changes.  Keep all follow-up visits as told by your doctor. This is important. Contact a doctor if:  You have pain that gets worse.  You have a fever.  You have not pooped for 4 days.  You throw up (vomit).  You are not hungry.  You lose weight.  You are bleeding from the anus.  You have thin, pencil-like poop (stool). Get help right away if:  You have a fever, and your symptoms suddenly get worse.  You leak poop or have blood in your poop.  Your belly feels hard or bigger than normal (is bloated).  You have very bad belly pain.  You feel dizzy or you faint. This information is not intended to replace advice given to you by your health care provider. Make sure you discuss any questions you have with your health care  provider. Document Revised: 12/04/2016 Document Reviewed: 06/12/2015 Elsevier Patient Education  2020 Elsevier Inc.  High-Fiber Diet Fiber, also called dietary fiber, is a type of carbohydrate that is found in fruits, vegetables, whole grains, and beans. A high-fiber diet can have many health benefits. Your health care provider may recommend a high-fiber diet to help:  Prevent constipation. Fiber can make your bowel movements more regular.  Lower your cholesterol.  Relieve the following conditions: ? Swelling of veins in the anus (hemorrhoids). ? Swelling and irritation (inflammation) of specific areas of the digestive tract (uncomplicated diverticulosis). ? A problem of the large intestine (colon) that sometimes causes pain and diarrhea (irritable bowel syndrome, IBS).  Prevent overeating as part of a weight-loss plan.  Prevent heart disease, type 2 diabetes, and certain cancers. What is my plan? The recommended daily fiber intake in grams (g) includes:  38 g for men age 23 or younger.  30 g for men over age 36.  25 g for women age 64 or younger.  21 g for women over age 77. You can get the recommended daily intake of dietary fiber by:  Eating a variety of fruits, vegetables, grains, and beans.  Taking a fiber supplement, if it is not possible to get enough fiber through your diet. What do I need to know about a high-fiber diet?  It is better to get fiber through food sources rather than from fiber supplements.  There is not a lot of research about how effective supplements are.  Always check the fiber content on the nutrition facts label of any prepackaged food. Look for foods that contain 5 g of fiber or more per serving.  Talk with a diet and nutrition specialist (dietitian) if you have questions about specific foods that are recommended or not recommended for your medical condition, especially if those foods are not listed below.  Gradually increase how much fiber you  consume. If you increase your intake of dietary fiber too quickly, you may have bloating, cramping, or gas.  Drink plenty of water. Water helps you to digest fiber. What are tips for following this plan?  Eat a wide variety of high-fiber foods.  Make sure that half of the grains that you eat each day are whole grains.  Eat breads and cereals that are made with whole-grain flour instead of refined flour or white flour.  Eat brown rice, bulgur wheat, or millet instead of white rice.  Start the day with a breakfast that is high in fiber, such as a cereal that contains 5 g of fiber or more per serving.  Use beans in place of meat in soups, salads, and pasta dishes.  Eat high-fiber snacks, such as berries, raw vegetables, nuts, and popcorn.  Choose whole fruits and vegetables instead of processed forms like juice or sauce. What foods can I eat?  Fruits Berries. Pears. Apples. Oranges. Avocado. Prunes and raisins. Dried figs. Vegetables Sweet potatoes. Spinach. Kale. Artichokes. Cabbage. Broccoli. Cauliflower. Green peas. Carrots. Squash. Grains Whole-grain breads. Multigrain cereal. Oats and oatmeal. Brown rice. Barley. Bulgur wheat. Millet. Quinoa. Bran muffins. Popcorn. Rye wafer crackers. Meats and other proteins Navy, kidney, and pinto beans. Soybeans. Split peas. Lentils. Nuts and seeds. Dairy Fiber-fortified yogurt. Beverages Fiber-fortified soy milk. Fiber-fortified orange juice. Other foods Fiber bars. The items listed above may not be a complete list of recommended foods and beverages. Contact a dietitian for more options. What foods are not recommended? Fruits Fruit juice. Cooked, strained fruit. Vegetables Fried potatoes. Canned vegetables. Well-cooked vegetables. Grains White bread. Pasta made with refined flour. White rice. Meats and other proteins Fatty cuts of meat. Fried chicken or fried fish. Dairy Milk. Yogurt. Cream cheese. Sour cream. Fats and  oils Butters. Beverages Soft drinks. Other foods Cakes and pastries. The items listed above may not be a complete list of foods and beverages to avoid. Contact a dietitian for more information. Summary  Fiber is a type of carbohydrate. It is found in fruits, vegetables, whole grains, and beans.  There are many health benefits of eating a high-fiber diet, such as preventing constipation, lowering blood cholesterol, helping with weight loss, and reducing your risk of heart disease, diabetes, and certain cancers.  Gradually increase your intake of fiber. Increasing too fast can result in cramping, bloating, and gas. Drink plenty of water while you increase your fiber.  The best sources of fiber include whole fruits and vegetables, whole grains, nuts, seeds, and beans. This information is not intended to replace advice given to you by your health care provider. Make sure you discuss any questions you have with your health care provider. Document Revised: 10/26/2016 Document Reviewed: 10/26/2016 Elsevier Patient Education  2020 Elsevier Inc.  Kegel Exercises  Kegel exercises can help strengthen your pelvic floor muscles. The pelvic floor is a group of muscles that support your rectum, small intestine, and bladder. In females, pelvic floor muscles also help support the womb (uterus). These muscles  help you control the flow of urine and stool. Kegel exercises are painless and simple, and they do not require any equipment. Your provider may suggest Kegel exercises to:  Improve bladder and bowel control.  Improve sexual response.  Improve weak pelvic floor muscles after surgery to remove the uterus (hysterectomy) or pregnancy (females).  Improve weak pelvic floor muscles after prostate gland removal or surgery (males). Kegel exercises involve squeezing your pelvic floor muscles, which are the same muscles you squeeze when you try to stop the flow of urine or keep from passing gas. The  exercises can be done while sitting, standing, or lying down, but it is best to vary your position. Exercises How to do Kegel exercises: 1. Squeeze your pelvic floor muscles tight. You should feel a tight lift in your rectal area. If you are a female, you should also feel a tightness in your vaginal area. Keep your stomach, buttocks, and legs relaxed. 2. Hold the muscles tight for up to 10 seconds. 3. Breathe normally. 4. Relax your muscles. 5. Repeat as told by your health care provider. Repeat this exercise daily as told by your health care provider. Continue to do this exercise for at least 4-6 weeks, or for as long as told by your health care provider. You may be referred to a physical therapist who can help you learn more about how to do Kegel exercises. Depending on your condition, your health care provider may recommend:  Varying how long you squeeze your muscles.  Doing several sets of exercises every day.  Doing exercises for several weeks.  Making Kegel exercises a part of your regular exercise routine. This information is not intended to replace advice given to you by your health care provider. Make sure you discuss any questions you have with your health care provider. Document Revised: 08/11/2017 Document Reviewed: 08/11/2017 Elsevier Patient Education  2020 [employer].

## 2019-11-21 NOTE — Progress Notes (Signed)
Post Partum Visit Note  Tracey Hale is a 44 y.o. [phone] female who presents for a postpartum visit. She is 6 weeks postpartum following a normal spontaneous vaginal delivery.  I have fully reviewed the prenatal and intrapartum course. The delivery was at 39/1 gestational weeks by induction just after version of breech positioning.  Anesthesia: epidural. Postpartum course has been frustrating.  Has perineal pain and constipation. Baby is doing well. Baby is feeding by both breast and bottle - Carnation Good Start. Bleeding no bleeding and brown. Bowel function is abnormal: Constipation. Bladder function is normal. Patient is not sexually active. Contraception method is Nexplanon. Postpartum depression screening: negative.   The pregnancy intention screening data noted above was reviewed. Potential methods of contraception were discussed. The patient elected to proceed with Hormonal Implant.      The following portions of the patient's history were reviewed and updated as appropriate: allergies, current medications, past family history, past medical history, past social history, past surgical history and problem list.  Review of Systems Pertinent items noted in HPI and remainder of comprehensive ROS otherwise negative.    Objective:  Last menstrual period 02/06/2019, unknown if currently breastfeeding. BP 110/73 and weight 151, BMI 27 General:  alert, cooperative and mild distress   Breasts:  deferred  Lungs: clear to auscultation bilaterally  Heart:  regular rate and rhythm, S1, S2 normal, no murmur, click, rub or gallop  Abdomen: soft, non-tender; bowel sounds normal; no masses,  no organomegaly   Vulva:  no pain at introitus but perineal body midline between introitus and rectum is tender to palpation especially when wiping and also painful with a BM. No ulcerations, no red granulation tissue seen, good approximation and no redness but tender to palpation   Vagina: not evaluated    Cervix:  not evaluated  Corpus: not examined  Adnexa:  no mass, fullness, tenderness  Rectal Exam: very small external hemorrhoids  - no inflammation        Assessment:    Healing postpartum exam. Pap smear not done at today's visit.   Plan:   Essential components of care per ACOG recommendations:  1.  Mood and well being: Patient with negative depression screening today. Reviewed local resources for support.  - Patient does not use tobacco.  - hx of drug use? No   2. Infant care and feeding:  -Patient currently breastmilk feeding? Yes  -Social determinants of health (SDOH) reviewed in EPIC. No concerns  3. Sexuality, contraception and birth spacing - Patient does not want a pregnancy in the next year.   - Reviewed forms of contraception in tiered fashion. Patient desired Nexplanon today.   - Discussed birth spacing of 18 months  4. Sleep and fatigue -Encouraged family/partner/community support of 4 hrs of uninterrupted sleep to help with mood and fatigue  5. Physical Recovery  - Discussed patients delivery and complications - Patient had a 2nd degree laceration, perineal healing reviewed. Patient expressed understanding - Patient has urinary incontinence? Yes  Patient was referred to pelvic floor PT  - Patient is not safe to resume sexual activity -  Will cleanse area after urination or BM with warm water in squeeze bottle and pat dry with toilet paper (has been using wet wipes and healing is not yet complete - wonder if chemicals in wipes is irritating.  There is no red granulation tissue seen, no ulceration, good approximation but pain to light palpation  6.  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 Chronic Disease BP normal today on BP medication - will stop medication and recheck BP in 2 weeks to see if medication is OK to be discontinued or needs to be restarted if BP  is elevated. Needs PP colpo - note to be scheduled sent in check out note Reviewed plan for constipation  - see AVS for additional info - recommended one ducolax suppository and then daily Miralax to continue to have a soft stool daily. Has anemia and unable to draw CBC today due to late hour in the clinic - consider at next visit.  Henrietta Dine, CMA [facility] [employer], [facility]  Nolene Bernheim, RN, MSN, NP-BC Nurse Practitioner, [facility] for [employer], [facility] 11/21/2019 5:09 PM

## 2019-11-28 ENCOUNTER — Other Ambulatory Visit: Payer: Self-pay

## 2019-11-28 ENCOUNTER — Ambulatory Visit (INDEPENDENT_AMBULATORY_CARE_PROVIDER_SITE_OTHER): Payer: Medicaid Other | Admitting: Family Medicine

## 2019-11-28 ENCOUNTER — Encounter: Payer: Self-pay | Admitting: Family Medicine

## 2019-11-28 VITALS — BP 116/81 | HR 69 | Wt 147.7 lb

## 2019-11-28 DIAGNOSIS — Z0130 Encounter for examination of blood pressure without abnormal findings: Secondary | ICD-10-CM | POA: Diagnosis not present

## 2019-11-28 LAB — POCT PREGNANCY, URINE: Preg Test, Ur: NEGATIVE

## 2019-11-28 NOTE — Progress Notes (Signed)
Patient had full postpartum visit with NP at 2 weeks postpartum, which was just supposed to be a problem focused visit for constipation. Patient presents today at 3 weeks for BP check and colposcopy, inappropriate to perform colposcopy 3 weeks postpartum. Patient BP well controlled, will continue off meds. Patient will RTC for colposcopy at 6 weeks postpartum. RN Fleet Contras discussed plan with patient. No provider visit today.  Alric Seton, MD OB Fellow, Faculty [facility] 11/28/2019 1:06 PM

## 2019-11-28 NOTE — Progress Notes (Signed)
Patient here for blood pressure check following vacuum assisted vaginal delivery on 11/07/19. Pt was instructed to dc Norvasc 5 mg at visit on 11/21/19, pt has not taken meds since that time. BP today is 116/81. Patient denies any symptoms of hypertension. Reviewed with Tracey Pomfret, MD who states pt may follow up in 3 weeks for colposcopy visit.   Fleet Contras RN 11/28/19

## 2019-12-05 ENCOUNTER — Ambulatory Visit: Payer: Medicaid Other

## 2019-12-12 ENCOUNTER — Telehealth: Payer: Medicaid Other | Admitting: Nurse Practitioner

## 2019-12-19 ENCOUNTER — Ambulatory Visit (INDEPENDENT_AMBULATORY_CARE_PROVIDER_SITE_OTHER): Payer: Medicaid Other | Admitting: Obstetrics and Gynecology

## 2019-12-19 ENCOUNTER — Other Ambulatory Visit: Payer: Self-pay

## 2019-12-19 ENCOUNTER — Other Ambulatory Visit (HOSPITAL_COMMUNITY)
Admission: RE | Admit: 2019-12-19 | Discharge: 2019-12-19 | Disposition: A | Discharge status: Home / Self Care | Payer: Medicaid Other | Source: Ambulatory Visit | Attending: Obstetrics and Gynecology | Admitting: Obstetrics and Gynecology

## 2019-12-19 ENCOUNTER — Encounter: Payer: Self-pay | Admitting: Obstetrics and Gynecology

## 2019-12-19 VITALS — BP 106/73 | HR 70 | Wt 145.3 lb

## 2019-12-19 DIAGNOSIS — R87612 Low grade squamous intraepithelial lesion on cytologic smear of cervix (LGSIL): Secondary | ICD-10-CM

## 2019-12-19 DIAGNOSIS — K5900 Constipation, unspecified: Secondary | ICD-10-CM | POA: Insufficient documentation

## 2019-12-19 DIAGNOSIS — O99893 Other specified diseases and conditions complicating puerperium: Secondary | ICD-10-CM

## 2019-12-19 DIAGNOSIS — O99019 Anemia complicating pregnancy, unspecified trimester: Secondary | ICD-10-CM | POA: Diagnosis not present

## 2019-12-19 DIAGNOSIS — Z3202 Encounter for pregnancy test, result negative: Secondary | ICD-10-CM | POA: Diagnosis not present

## 2019-12-19 DIAGNOSIS — Z01419 Encounter for gynecological examination (general) (routine) without abnormal findings: Secondary | ICD-10-CM

## 2019-12-19 DIAGNOSIS — Z8759 Personal history of other complications of pregnancy, childbirth and the puerperium: Secondary | ICD-10-CM

## 2019-12-19 DIAGNOSIS — Z9889 Other specified postprocedural states: Secondary | ICD-10-CM

## 2019-12-19 LAB — CBC
Hematocrit: 35.5 % (ref 34.0–46.6)
Hemoglobin: 11.2 g/dL (ref 11.1–15.9)
MCH: 24.5 pg — ABNORMAL LOW (ref 26.6–33.0)
MCHC: 31.5 g/dL (ref 31.5–35.7)
MCV: 78 fL — ABNORMAL LOW (ref 79–97)
Platelets: 314 10*3/uL (ref 150–450)
RBC: 4.58 x10E6/uL (ref 3.77–5.28)
RDW: 20.3 % — ABNORMAL HIGH (ref 11.7–15.4)
WBC: 5 10*3/uL (ref 3.4–10.8)

## 2019-12-19 LAB — POCT PREGNANCY, URINE: Preg Test, Ur: NEGATIVE

## 2019-12-19 NOTE — Patient Instructions (Signed)
Colposcopy, Care After This sheet gives you information about how to care for yourself after your procedure. Your doctor may also give you more specific instructions. If you have problems or questions, contact your doctor. What can I expect after the procedure? If you did not have a tissue sample removed (did not have a biopsy), you may only have some spotting for a few days. You can go back to your normal activities. If you had a tissue sample removed, it is common to have:  Soreness and pain. This may last for a few days.  Light-headedness.  Mild bleeding from your vagina or dark-colored, grainy discharge from your vagina. This may last for a few days. You may need to wear a sanitary pad.  Spotting for at least 48 hours after the procedure. Follow these instructions at home:   Take over-the-counter and prescription medicines only as told by your doctor. Ask your doctor what medicines you can start taking again. This is very important if you take blood-thinning medicine.  Do not drive or use heavy machinery while taking prescription pain medicine.  For 3 days, or as long as your doctor tells you, avoid: ? Douching. ? Using tampons. ? Having sex.  If you use birth control (contraception), keep using it.  Limit activity for the first day after the procedure. Ask your doctor what activities are safe for you.  It is up to you to get the results of your procedure. Ask your doctor when your results will be ready.  Keep all follow-up visits as told by your doctor. This is important. Contact a doctor if:  You get a skin rash. Get help right away if:  You are bleeding a lot from your vagina. It is a lot of bleeding if you are using more than one pad an hour for 2 hours in a row.  You have clumps of blood (blood clots) coming from your vagina.  You have a fever.  You have chills  You have pain in your lower belly (pelvic area).  You have signs of infection, such as vaginal  discharge that is: ? Different than usual. ? Yellow. ? Bad-smelling.  You have very pain or cramps in your lower belly that do not get better with medicine.  You feel light-headed.  You feel dizzy.  You pass out (faint). Summary  If you did not have a tissue sample removed (did not have a biopsy), you may only have some spotting for a few days. You can go back to your normal activities.  If you had a tissue sample removed, it is common to have mild pain and spotting for 48 hours.  For 3 days, or as long as your doctor tells you, avoid douching, using tampons and having sex.  Get help right away if you have bleeding, very bad pain, or signs of infection. This information is not intended to replace advice given to you by your health care provider. Make sure you discuss any questions you have with your health care provider. Document Revised: 12/04/2016 Document Reviewed: 09/11/2015 Elsevier Patient Education  2020 [employer]   Postpartum Care After Vaginal Delivery This sheet gives you information about how to care for yourself from the time you deliver your baby to up to 6-12 weeks after delivery (postpartum period). Your health care provider may also give you more specific instructions. If you have problems or questions, contact your health care provider. Follow these instructions at home: Vaginal bleeding  It is normal to  have vaginal bleeding (lochia) after delivery. Wear a sanitary pad for vaginal bleeding and discharge. ? During the first week after delivery, the amount and appearance of lochia is often similar to a menstrual period. ? Over the next few weeks, it will gradually decrease to a dry, yellow-brown discharge. ? For most women, lochia stops completely by 4-6 weeks after delivery. Vaginal bleeding can vary from woman to woman.  Change your sanitary pads frequently. Watch for any changes in your flow, such as: ? A sudden increase in volume. ? A change in  color. ? Large blood clots.  If you pass a blood clot from your vagina, save it and call your health care provider to discuss. Do not flush blood clots down the toilet before talking with your health care provider.  Do not use tampons or douches until your health care provider says this is safe.  If you are not breastfeeding, your period should return 6-8 weeks after delivery. If you are feeding your child breast milk only (exclusive breastfeeding), your period may not return until you stop breastfeeding. Perineal care  Keep the area between the vagina and the anus (perineum) clean and dry as told by your health care provider. Use medicated pads and pain-relieving sprays and creams as directed.  If you had a cut in the perineum (episiotomy) or a tear in the vagina, check the area for signs of infection until you are healed. Check for: ? More redness, swelling, or pain. ? Fluid or blood coming from the cut or tear. ? Warmth. ? Pus or a bad smell.  You may be given a squirt bottle to use instead of wiping to clean the perineum area after you go to the bathroom. As you start healing, you may use the squirt bottle before wiping yourself. Make sure to wipe gently.  To relieve pain caused by an episiotomy, a tear in the vagina, or swollen veins in the anus (hemorrhoids), try taking a warm sitz bath 2-3 times a day. A sitz bath is a warm water bath that is taken while you are sitting down. The water should only come up to your hips and should cover your buttocks. Breast care  Within the first few days after delivery, your breasts may feel heavy, full, and uncomfortable (breast engorgement). Milk may also leak from your breasts. Your health care provider can suggest ways to help relieve the discomfort. Breast engorgement should go away within a few days.  If you are breastfeeding: ? Wear a bra that supports your breasts and fits you well. ? Keep your nipples clean and dry. Apply creams and  ointments as told by your health care provider. ? You may need to use breast pads to absorb milk that leaks from your breasts. ? You may have uterine contractions every time you breastfeed for up to several weeks after delivery. Uterine contractions help your uterus return to its normal size. ? If you have any problems with breastfeeding, work with your health care provider or Advertising copywriter.  If you are not breastfeeding: ? Avoid touching your breasts a lot. Doing this can make your breasts produce more milk. ? Wear a good-fitting bra and use cold packs to help with swelling. ? Do not squeeze out (express) milk. This causes you to make more milk. Intimacy and sexuality  Ask your health care provider when you can engage in sexual activity. This may depend on: ? Your risk of infection. ? How fast you are healing. ? Your  comfort and desire to engage in sexual activity.  You are able to get pregnant after delivery, even if you have not had your period. If desired, talk with your health care provider about methods of birth control (contraception). Medicines  Take over-the-counter and prescription medicines only as told by your health care provider.  If you were prescribed an antibiotic medicine, take it as told by your health care provider. Do not stop taking the antibiotic even if you start to feel better. Activity  Gradually return to your normal activities as told by your health care provider. Ask your health care provider what activities are safe for you.  Rest as much as possible. Try to rest or take a nap while your baby is sleeping. Eating and drinking   Drink enough fluid to keep your urine pale yellow.  Eat high-fiber foods every day. These may help prevent or relieve constipation. High-fiber foods include: ? Whole grain cereals and breads. ? Brown rice. ? Beans. ? Fresh fruits and vegetables.  Do not try to lose weight quickly by cutting back on calories.  Take  your prenatal vitamins until your postpartum checkup or until your health care provider tells you it is okay to stop. Lifestyle  Do not use any products that contain nicotine or tobacco, such as cigarettes and e-cigarettes. If you need help quitting, ask your health care provider.  Do not drink alcohol, especially if you are breastfeeding. General instructions  Keep all follow-up visits for you and your baby as told by your health care provider. Most women visit their health care provider for a postpartum checkup within the first 3-6 weeks after delivery. Contact a health care provider if:  You feel unable to cope with the changes that your child brings to your life, and these feelings do not go away.  You feel unusually sad or worried.  Your breasts become red, painful, or hard.  You have a fever.  You have trouble holding urine or keeping urine from leaking.  You have little or no interest in activities you used to enjoy.  You have not breastfed at all and you have not had a menstrual period for 12 weeks after delivery.  You have stopped breastfeeding and you have not had a menstrual period for 12 weeks after you stopped breastfeeding.  You have questions about caring for yourself or your baby.  You pass a blood clot from your vagina. Get help right away if:  You have chest pain.  You have difficulty breathing.  You have sudden, severe leg pain.  You have severe pain or cramping in your lower abdomen.  You bleed from your vagina so much that you fill more than one sanitary pad in one hour. Bleeding should not be heavier than your heaviest period.  You develop a severe headache.  You faint.  You have blurred vision or spots in your vision.  You have bad-smelling vaginal discharge.  You have thoughts about hurting yourself or your baby. If you ever feel like you may hurt yourself or others, or have thoughts about taking your own life, get help right away. You can go  to the nearest emergency department or call:  Your local emergency services (911 in the [place]  A suicide crisis helpline, such as the National Suicide Prevention Lifeline at 270-220-84751-910 472 8065. This is open 24 hours a day. Summary  The period of time right after you deliver your newborn up to 6-12 weeks after delivery is called the postpartum period.  Gradually return to your normal activities as told by your health care provider.  Keep all follow-up visits for you and your baby as told by your health care provider. This information is not intended to replace advice given to you by your health care provider. Make sure you discuss any questions you have with your health care provider. Document Revised: 12/25/2016 Document Reviewed: 10/05/2016 Elsevier Patient Education  2020 [employer].

## 2019-12-19 NOTE — Progress Notes (Signed)
Post Partum Visit Note  Tracey Hale is a 44 y.o. [phone] female who presents for a postpartum visit. She is 6 weeks postpartum following a normal spontaneous vaginal delivery.  I have fully reviewed the prenatal and intrapartum course. The delivery was at [redacted]w[redacted]d gestational weeks.  Anesthesia: epidural. Postpartum course has been uncomplicated. Baby is doing well. Baby is feeding by both breast and bottle - Gerber Gentle. Bleeding staining only. Bowel function is normal. Bladder function is normal. Patient is not sexually active. Contraception method is Nexplanon. Postpartum depression screening: negative.  Mostly bottle feeding. No mood concerns today.  The pregnancy intention screening data noted above was reviewed. Potential methods of contraception were discussed. The patient had a Nexplanon placed prior to hospital discharge on 11/08/19.   Colposcopy (without biopsies or ECC) performed on 05/22/19 in pregnancy.  The following portions of the patient's history were reviewed and updated as appropriate: allergies, current medications, past family history, past medical history, past social history, past surgical history and problem list.  Review of Systems Pertinent items noted in HPI and remainder of comprehensive ROS otherwise negative.    Objective:  BP 106/73   Pulse 70   Wt 145 lb 4.8 oz (65.9 kg)   Breastfeeding No   BMI 26.58 kg/m    General:  alert, cooperative and appears stated age   Breasts:  not evaluated  Lungs: normal WOB  Heart:  regular rate  Abdomen: soft, non-tender   Vulva:  normal  Vagina: normal vagina, no discharge, exudate, lesion, or erythema  Cervix:  acetowhite lesion(s) noted at 11 o'clock & 12 o'clock as noted below  Corpus: not examined  Adnexa:  not evaluated  Rectal Exam: Not performed.        Assessment:    Normal postpartum exam escept for acetowhite lesions on colposcopy as noted (biospies obtained). Pap smear not indicated at today's visit.    Plan:   Essential components of care per ACOG recommendations:  1.  Mood and well being: Patient with negative depression screening today. Reviewed local resources for support.  - Patient does not use tobacco.  - hx of drug use? No   2. Infant care and feeding:  -Patient currently breastmilk feeding? Yes Reviewed importance of draining breast regularly to support lactation. -Social determinants of health (SDOH) reviewed in EPIC. No concerns.  3. Sexuality, contraception and birth spacing - Patient does not want a pregnancy in the next year.  Desired family size is undecided.  - Reviewed forms of contraception in tiered fashion. Patient has Nexplanon in placed (inserted prior to hospital discharge s/p delivery). - Discussed birth spacing of 18 months  4. Sleep and fatigue -Encouraged family/partner/community support of 4 hrs of uninterrupted sleep to help with mood and fatigue  5. Physical Recovery  - Discussed patient's delivery and complications - Patient had a 2nd degree laceration, perineal healing reviewed. Patient expressed understanding - Patient has urinary incontinence? No  - Patient is safe to resume physical and sexual activity  6.  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  7. Chronic Disease - Constipation: improved. Counseled on good hydration and increased fiber intake. Encouraged PCP f/u as needed. - Anemia in pregnancy: Hgb 7.6 on hospital discharge s/p delivery. No current iron supplementation. F/u repeat CBC today.  COLPOSCOPY PROCEDURE NOTE  44 y.o. [id number] here for colposcopy for pap result of LSIL with +HRHPV on 04/24/19. Colposcopy was performed in  pregnancy (without biopsies or ECC) on 05/22/19. Discussed role for HPV in cervical dysplasia, need for surveillance.  Patient gave informed written consent, time out was performed.  Placed in lithotomy position. Cervix viewed with speculum and  colposcope after application of acetic acid.   Colposcopy adequate? Yes  acetowhite lesion(s) noted at 11 o'clock & 12 o'clock; corresponding biopsies obtained.  ECC specimen obtained. All specimens were labeled and sent to pathology.  Patient was given post procedure instructions.  Will follow up pathology and manage accordingly; patient will be contacted with results and recommendations.  Routine preventative health maintenance measures emphasized.  Sheila Oats, MD OB Fellow, Faculty Practice 12/19/2019 9:39 AM

## 2019-12-21 LAB — SURGICAL PATHOLOGY

## 2020-03-27 DIAGNOSIS — K219 Gastro-esophageal reflux disease without esophagitis: Secondary | ICD-10-CM | POA: Diagnosis not present

## 2020-03-27 DIAGNOSIS — L700 Acne vulgaris: Secondary | ICD-10-CM | POA: Diagnosis not present

## 2020-08-26 DIAGNOSIS — Z13220 Encounter for screening for lipoid disorders: Secondary | ICD-10-CM | POA: Diagnosis not present

## 2020-08-26 DIAGNOSIS — Z0000 Encounter for general adult medical examination without abnormal findings: Secondary | ICD-10-CM | POA: Diagnosis not present

## 2020-08-26 DIAGNOSIS — K219 Gastro-esophageal reflux disease without esophagitis: Secondary | ICD-10-CM | POA: Diagnosis not present

## 2020-08-26 DIAGNOSIS — Z1329 Encounter for screening for other suspected endocrine disorder: Secondary | ICD-10-CM | POA: Diagnosis not present

## 2021-01-05 ENCOUNTER — Emergency Department (HOSPITAL_COMMUNITY): Payer: Medicaid Other

## 2021-01-05 ENCOUNTER — Other Ambulatory Visit: Payer: Self-pay

## 2021-01-05 ENCOUNTER — Emergency Department (HOSPITAL_COMMUNITY)
Admission: EM | Admit: 2021-01-05 | Discharge: 2021-01-05 | Disposition: A | Discharge status: Home / Self Care | Payer: Medicaid Other | Attending: Emergency Medicine | Admitting: Emergency Medicine

## 2021-01-05 ENCOUNTER — Encounter (HOSPITAL_COMMUNITY): Payer: Self-pay

## 2021-01-05 DIAGNOSIS — N10 Acute tubulo-interstitial nephritis: Secondary | ICD-10-CM | POA: Diagnosis not present

## 2021-01-05 DIAGNOSIS — R9431 Abnormal electrocardiogram [ECG] [EKG]: Secondary | ICD-10-CM | POA: Diagnosis not present

## 2021-01-05 DIAGNOSIS — Z79899 Other long term (current) drug therapy: Secondary | ICD-10-CM | POA: Diagnosis not present

## 2021-01-05 DIAGNOSIS — N200 Calculus of kidney: Secondary | ICD-10-CM | POA: Diagnosis not present

## 2021-01-05 DIAGNOSIS — R300 Dysuria: Secondary | ICD-10-CM | POA: Diagnosis not present

## 2021-01-05 DIAGNOSIS — R109 Unspecified abdominal pain: Secondary | ICD-10-CM | POA: Insufficient documentation

## 2021-01-05 DIAGNOSIS — N12 Tubulo-interstitial nephritis, not specified as acute or chronic: Secondary | ICD-10-CM

## 2021-01-05 DIAGNOSIS — M545 Low back pain, unspecified: Secondary | ICD-10-CM | POA: Diagnosis not present

## 2021-01-05 DIAGNOSIS — K429 Umbilical hernia without obstruction or gangrene: Secondary | ICD-10-CM | POA: Diagnosis not present

## 2021-01-05 LAB — CBC WITH DIFFERENTIAL/PLATELET
Abs Immature Granulocytes: 0.07 10*3/uL (ref 0.00–0.07)
Basophils Absolute: 0 10*3/uL (ref 0.0–0.1)
Basophils Relative: 0 %
Eosinophils Absolute: 0 10*3/uL (ref 0.0–0.5)
Eosinophils Relative: 0 %
HCT: 33.8 % — ABNORMAL LOW (ref 36.0–46.0)
Hemoglobin: 10.9 g/dL — ABNORMAL LOW (ref 12.0–15.0)
Immature Granulocytes: 1 %
Lymphocytes Relative: 7 %
Lymphs Abs: 0.9 10*3/uL (ref 0.7–4.0)
MCH: 28 pg (ref 26.0–34.0)
MCHC: 32.2 g/dL (ref 30.0–36.0)
MCV: 86.9 fL (ref 80.0–100.0)
Monocytes Absolute: 1.5 10*3/uL — ABNORMAL HIGH (ref 0.1–1.0)
Monocytes Relative: 12 %
Neutro Abs: 10.3 10*3/uL — ABNORMAL HIGH (ref 1.7–7.7)
Neutrophils Relative %: 80 %
Platelets: 257 10*3/uL (ref 150–400)
RBC: 3.89 MIL/uL (ref 3.87–5.11)
RDW: 12.9 % (ref 11.5–15.5)
WBC: 12.8 10*3/uL — ABNORMAL HIGH (ref 4.0–10.5)
nRBC: 0 % (ref 0.0–0.2)

## 2021-01-05 LAB — COMPREHENSIVE METABOLIC PANEL
ALT: 36 U/L (ref 0–44)
AST: 28 U/L (ref 15–41)
Albumin: 3.7 g/dL (ref 3.5–5.0)
Alkaline Phosphatase: 53 U/L (ref 38–126)
Anion gap: 3 — ABNORMAL LOW (ref 5–15)
BUN: 8 mg/dL (ref 6–20)
CO2: 25 mmol/L (ref 22–32)
Calcium: 8 mg/dL — ABNORMAL LOW (ref 8.9–10.3)
Chloride: 102 mmol/L (ref 98–111)
Creatinine, Ser: 0.65 mg/dL (ref 0.44–1.00)
GFR, Estimated: >60 mL/min (ref >60)
Glucose, Bld: 114 mg/dL — ABNORMAL HIGH (ref 70–99)
Potassium: 3.3 mmol/L — ABNORMAL LOW (ref 3.5–5.1)
Sodium: 130 mmol/L — ABNORMAL LOW (ref 135–145)
Total Bilirubin: 0.7 mg/dL (ref 0.3–1.2)
Total Protein: 7.3 g/dL (ref 6.5–8.1)

## 2021-01-05 LAB — URINALYSIS, ROUTINE W REFLEX MICROSCOPIC
Bilirubin Urine: NEGATIVE
Glucose, UA: NEGATIVE mg/dL
Ketones, ur: 20 mg/dL — AB
Nitrite: POSITIVE — AB
Protein, ur: 30 mg/dL — AB
Specific Gravity, Urine: 1.017 (ref 1.005–1.030)
WBC, UA: >50 WBC/hpf — ABNORMAL HIGH (ref 0–5)
pH: 5 (ref 5.0–8.0)

## 2021-01-05 LAB — LIPASE, BLOOD: Lipase: 28 U/L (ref 11–51)

## 2021-01-05 LAB — I-STAT BETA HCG BLOOD, ED (MC, WL, AP ONLY): I-stat hCG, quantitative: <5 m[IU]/mL (ref <5)

## 2021-01-05 LAB — LACTIC ACID, PLASMA: Lactic Acid, Venous: 0.6 mmol/L (ref 0.5–1.9)

## 2021-01-05 MED ORDER — ONDANSETRON 4 MG PO TBDP
4.0000 mg | ORAL_TABLET | Freq: Once | ORAL | Status: AC | PRN
  Administered 2021-01-05: 4 mg via ORAL
  Filled 2021-01-05: qty 1

## 2021-01-05 MED ORDER — AMOXICILLIN-POT CLAVULANATE 875-125 MG PO TABS: 1.0000 | ORAL_TABLET | Freq: Two times a day (BID) | ORAL | 0 refills | Status: AC

## 2021-01-05 MED ORDER — SODIUM CHLORIDE 0.9 % IV BOLUS
1000.0000 mL | Freq: Once | INTRAVENOUS | Status: AC
  Administered 2021-01-05: 1000 mL via INTRAVENOUS

## 2021-01-05 MED ORDER — ACETAMINOPHEN 325 MG PO TABS
650.0000 mg | ORAL_TABLET | Freq: Once | ORAL | Status: AC
Start: 2021-01-05 — End: 2021-01-05
  Administered 2021-01-05: 650 mg via ORAL
  Filled 2021-01-05: qty 2

## 2021-01-05 MED ORDER — SODIUM CHLORIDE 0.9 % IV SOLN
2.0000 g | Freq: Once | INTRAVENOUS | Status: AC
  Administered 2021-01-05: 2 g via INTRAVENOUS
  Filled 2021-01-05: qty 20

## 2021-01-05 NOTE — Discharge Instructions (Signed)
Make sure you are getting plenty of rest and drink a lot of fluids.  Use Tylenol if needed for pain or fever.  We are giving you a work release note for 3 days.  Follow-up with your doctor in 1 or 2 weeks.

## 2021-01-05 NOTE — ED Triage Notes (Signed)
Patient c/o left lower back pain and LLQ abdominal pain with dysuria x 4-5 days ago. Patient states she has a history of kidney stones. Patient c/o chills, fever, and chest pain today. Patient states she has been taking TheraFlu with no relief.

## 2021-01-05 NOTE — ED Provider Notes (Signed)
[place] DEPT Provider Note   CSN: [id number] Arrival date & time: 01/05/21  1435     History  Chief Complaint  Patient presents with   Chills   Back Pain   Abdominal Pain   Chest Pain   Dysuria    Tracey Hale is a 46 y.o. female.  HPI She is presenting from home for evaluation of lower back pain, abdominal pain and dysuria for 5 days.  She has also had multiple episodes of vomiting, yesterday evening.  She has a sensation of chills and at times feels warm.  Her chest feels uncomfortable.  She is using over-the-counter medications, without relief.  She has had prior UTIs and felt similar.  She is not vomiting or having diarrhea.  She has urinary urgency and dysuria without hematuria.  She denies other recent illnesses.    Home Medications Prior to Admission medications   Medication Sig Start Date End Date Taking? Authorizing Provider  amoxicillin-clavulanate (AUGMENTIN) 875-125 MG tablet Take 1 tablet by mouth 2 (two) times daily. One po bid x 10 days 01/05/21  Yes Daleen Bo, MD  omeprazole (PRILOSEC) 40 MG capsule Take 40 mg by mouth daily. 12/05/19  Yes [provider]  Phenylephrine-Pheniramine-DM [facility] COLD & COUGH PO) Take 1 packet by mouth daily as needed (cold symptoms).   Yes [provider]  amLODipine (NORVASC) 5 MG tablet Take 1 tablet (5 mg total) by mouth daily. 11/09/19 11/21/19  Janet Berlin, MD      Allergies    Patient has no known allergies.    Review of Systems   Review of Systems  All other systems reviewed and are negative.  Physical Exam Updated Vital Signs BP 98/67    Pulse 68    Temp 99.2 F (37.3 C) (Oral)    Resp 16    Ht 5\' 2"  (1.575 m)    Wt 61.2 kg    LMP  (LMP Unknown)    SpO2 99%    BMI 24.69 kg/m  Physical Exam Vitals and nursing note reviewed.  Constitutional:      General: She is in acute distress.     Appearance: She is well-developed. She is ill-appearing. She is not  toxic-appearing or diaphoretic.  HENT:     Head: Normocephalic and atraumatic.     Right Ear: External ear normal.     Left Ear: External ear normal.  Eyes:     Conjunctiva/sclera: Conjunctivae normal.     Pupils: Pupils are equal, round, and reactive to light.  Neck:     Trachea: Phonation normal.  Cardiovascular:     Rate and Rhythm: Normal rate and regular rhythm.     Heart sounds: Normal heart sounds.  Pulmonary:     Effort: Pulmonary effort is normal. No respiratory distress.     Breath sounds: Normal breath sounds. No stridor.  Abdominal:     General: There is no distension.     Palpations: Abdomen is soft.     Tenderness: There is no abdominal tenderness.  Musculoskeletal:        General: Normal range of motion.     Cervical back: Normal range of motion and neck supple.  Skin:    General: Skin is warm and dry.  Neurological:     Mental Status: She is alert and oriented to person, place, and time.     Cranial Nerves: No cranial nerve deficit.     Sensory: No sensory deficit.  Motor: No abnormal muscle tone.     Coordination: Coordination normal.  Psychiatric:        Mood and Affect: Mood normal.        Behavior: Behavior normal.        Thought Content: Thought content normal.        Judgment: Judgment normal.    ED Results / Procedures / Treatments   Labs (all labs ordered are listed, but only abnormal results are displayed) Labs Reviewed  COMPREHENSIVE METABOLIC PANEL - Abnormal; Notable for the following components:      Result Value   Sodium 130 (*)    Potassium 3.3 (*)    Glucose, Bld 114 (*)    Calcium 8.0 (*)    Anion gap 3 (*)    All other components within normal limits  CBC WITH DIFFERENTIAL/PLATELET - Abnormal; Notable for the following components:   WBC 12.8 (*)    Hemoglobin 10.9 (*)    HCT 33.8 (*)    Neutro Abs 10.3 (*)    Monocytes Absolute 1.5 (*)    All other components within normal limits  URINALYSIS, ROUTINE W REFLEX MICROSCOPIC -  Abnormal; Notable for the following components:   APPearance HAZY (*)    Hgb urine dipstick SMALL (*)    Ketones, ur 20 (*)    Protein, ur 30 (*)    Nitrite POSITIVE (*)    Leukocytes,Ua SMALL (*)    WBC, UA >50 (*)    Bacteria, UA MANY (*)    All other components within normal limits  URINE CULTURE  CULTURE, BLOOD (ROUTINE X 2)  CULTURE, BLOOD (ROUTINE X 2)  LIPASE, BLOOD  LACTIC ACID, PLASMA  I-STAT BETA HCG BLOOD, ED ([facility], [facility], [facility] ONLY)    EKG EKG Interpretation  Date/Time:  Sunday January 05 2021 14:47:55 EST Ventricular Rate:  93 PR Interval:  147 QRS Duration: 94 QT Interval:  336 QTC Calculation: 418 R Axis:   77 Text Interpretation: Sinus rhythm Low voltage, precordial leads RSR' in V1 or V2, right VCD or RVH No old tracing to compare Confirmed by Daleen Bo [phone]) on 01/05/2021 7:42:43 PM  Radiology CT Renal Stone Study  Result Date: 01/05/2021 CLINICAL DATA:  Left lower back and left lower quadrant pain and dysuria for 4-5 days. History of kidney stones. EXAM: CT ABDOMEN AND PELVIS WITHOUT CONTRAST TECHNIQUE: Multidetector CT imaging of the abdomen and pelvis was performed following the standard protocol without IV contrast. COMPARISON:  None. FINDINGS: Lower chest: The lung bases are clear of acute process. No pleural effusion or pulmonary lesions. The heart is normal in size. No pericardial effusion. The distal esophagus and aorta are unremarkable. Hepatobiliary: No hepatic lesions or intrahepatic biliary dilatation. The gallbladder is unremarkable. No common bile duct dilatation. Pancreas: No mass, inflammation or ductal dilatation. Spleen: Normal size.  No focal lesions. Adrenals/Urinary Tract: Adrenal glands are normal. A few small bilateral renal calculi are noted. No obstructing ureteral calculi or bladder calculi. There are moderate cortical scarring changes involving the left kidney which may be related to prior infection. No worrisome renal lesions. Stomach/Bowel:  The stomach, duodenum, small bowel and colon are unremarkable. No acute inflammatory changes, mass lesions or obstructive findings. The terminal ileum is normal. The appendix is normal. Vascular/Lymphatic: The aorta is normal in caliber. No atheroscerlotic calcifications. No mesenteric of retroperitoneal mass or adenopathy. Small scattered lymph nodes are noted. Reproductive: The uterus and ovaries are unremarkable. There is a simple appearing 3.3 cm cyst  associated with the left ovary No follow-up imaging recommended. Note: This recommendation does not apply to premenarchal patients and to those with increased risk (genetic, family history, elevated tumor markers or other high-risk factors) of ovarian cancer. Reference: JACR 2020 Feb; 17(2):248-254 Other: No pelvic mass or adenopathy. No free pelvic fluid collections. No inguinal mass or adenopathy. Small periumbilical abdominal wall hernia containing fat. Musculoskeletal: No significant bony findings. IMPRESSION: 1. Small bilateral renal calculi but no obstructing ureteral calculi or bladder calculi. 2. Moderate cortical scarring changes involving the left kidney. 3. No acute abdominal/pelvic findings, mass lesions or adenopathy. 4. 3.3 cm simple appearing left ovarian cyst. No imaging follow-up is necessary. Electronically Signed   By: Rudie Meyer M.D.   On: 01/05/2021 16:30    Procedures Procedures    Medications Ordered in ED Medications  ondansetron (ZOFRAN-ODT) disintegrating tablet 4 mg (4 mg Oral Given 01/05/21 1717)  sodium chloride 0.9 % bolus 1,000 mL (0 mLs Intravenous Stopped 01/05/21 2111)  acetaminophen (TYLENOL) tablet 650 mg (650 mg Oral Given 01/05/21 1854)  cefTRIAXone (ROCEPHIN) 2 g in sodium chloride 0.9 % 100 mL IVPB (0 g Intravenous Stopped 01/05/21 2111)    ED Course/ Medical Decision Making/ A&P                           Medical Decision Making   Patient Vitals for the past 24 hrs:  BP Temp Temp src Pulse Resp SpO2 Height  Weight  01/05/21 2215 98/67 -- -- 68 16 99 % -- --  01/05/21 2200 93/63 -- -- 67 -- 99 % -- --  01/05/21 2130 95/64 -- -- 73 17 99 % -- --  01/05/21 2032 94/65 99.2 F (37.3 C) Oral 71 15 98 % -- --  01/05/21 1915 100/70 -- -- 87 18 98 % -- --  01/05/21 1901 102/70 -- -- 86 16 97 % -- --  01/05/21 1857 -- (!) 102.9 F (39.4 C) -- -- -- -- -- --  01/05/21 1746 109/70 -- -- 92 14 100 % -- --  01/05/21 1452 -- -- -- -- -- -- 5\' 2"  (1.575 m) 61.2 kg  01/05/21 1448 -- 98.5 F (36.9 C) Oral 87 10 96 % -- --  01/05/21 1447 -- -- -- 91 -- 98 % -- --  01/05/21 1445 112/84 -- -- 93 -- 99 % -- --    10:27 PM Reevaluation with update and discussion with patient and a female companion. After initial assessment and treatment, an updated evaluation reveals she is comfortable, able ambulate and has reassuring vital signs. Illness risk, worsening condition, discussed. Mancel Bale    Medical Decision Making: Summary: Patient with signs and symptoms of pyelonephritis, for several days, worsening despite attempts at comfort care at home.  Critical Interventions-labs, radiography; to evaluate  Chief Complaint  Patient presents with   Chills   Back Pain   Abdominal Pain   Chest Pain   Dysuria    and assess for illness characterized as  urinary tract infection, Acute, Previously Undiagnosed, Uncertain Prognosis, Complicated, and Systemic Symptoms   After These Interventions, the Patient was reevaluated and was found with urinary tract infection, likely pyelonephritis, without evidence for sepsis or metabolic instability.  Patient is candidate for outpatient treatment for management at home.  This patient is Presenting for Evaluation of fever, chills, urinary tract symptoms, which does require a range of treatment options, and is a complaint that involves a moderate risk  of morbidity and mortality. The Differential Diagnoses include UTI, pyelonephritis, sepsis. .  I did not require additional Historical  Information from anyone, as the patient is a good historian.  Clinical Laboratory Tests Ordered, included CBC, Metabolic panel, Urinalysis, and urine culture lactate, blood cultures, pregnancy test. Review indicates normal except urinalysis consistent with infection, white count high, sodium low, potassium slightly low, glucose high. Emergent testing abnormality management required for stabilization-urinary tract infection Radiologic Tests Ordered, included CT abdomen pelvis renal.  I independently Visualized: Radiograph images, which show no obstructive pathology in the ureters.  Possible prior urinary tract infection left kidney    Pharmaceutical Risk Management treatment for urinary tract infection Parenteral Treatment  Treatment Complication Risk-she can be managed as an outpatient does not require inpatient care for this problem.      CRITICAL CARE-no Performed by: Daleen Bo  Nursing Notes Reviewed/ Care Coordinated Applicable Imaging Reviewed Interpretation of Laboratory Data incorporated into ED treatment  The patient appears reasonably screened and/or stabilized for discharge and I doubt any other medical condition or other [facility] requiring further screening, evaluation, or treatment in the ED at this time prior to discharge.  Plan: Home Medications-OTC analgesia antipyretic; Home Treatments-rest, fluids; return here if the recommended treatment, does not improve the symptoms; Recommended follow up-PCP checkup 1 week and as needed            Final Clinical Impression(s) / ED Diagnoses Final diagnoses:  Pyelonephritis    Rx / DC Orders ED Discharge Orders          Ordered    amoxicillin-clavulanate (AUGMENTIN) 875-125 MG tablet  2 times daily        01/05/21 2226              Daleen Bo, MD 01/05/21 2235

## 2021-01-05 NOTE — ED Provider Notes (Signed)
Emergency Medicine Provider Triage Evaluation Note  Tracey Hale , a 46 y.o. female  was evaluated in triage.  Pt complains of left lower back pain and abdominal pain with dysuria x4 to 5 days with associated nausea and 5 episodes of NBNB emesis since last night.  Endorses chills and feeling warm as well as new chest pain today.  Taking TheraFlu and omeprazole without relief.  Review of Systems  Positive: Dysuria, abdominal pain, flank pain, urinary frequency and urgency with decreased urine output Negative: Shortness of breath  Physical Exam  BP 112/84    Pulse 87    Temp 98.5 F (36.9 C) (Oral)    Resp 10    Ht 5\' 2"  (1.575 m)    Wt 61.2 kg    LMP  (LMP Unknown)    SpO2 96%    BMI 24.69 kg/m  Gen:   Awake, no distress   Resp:  Normal effort  MSK:   Moves extremities without difficulty  Other:  Left-sided flank pain, suprapubic tenderness palpation.  Lung CTA B.  Medical Decision Making  Medically screening exam initiated at 3:20 PM.  Appropriate orders placed.  Tracey Hale was informed that the remainder of the evaluation will be completed by another provider, this initial triage assessment does not replace that evaluation, and the importance of remaining in the ED until their evaluation is complete.  This chart was dictated using voice recognition software, Dragon. Despite the best efforts of this provider to proofread and correct errors, errors may still occur which can change documentation meaning.    Emeline Darling, PA-C 01/05/21 1521    Daleen Bo, MD 01/06/21 1023

## 2021-01-07 LAB — URINE CULTURE: Culture: >=100000 — AB

## 2021-01-08 ENCOUNTER — Telehealth: Payer: Self-pay | Admitting: Emergency Medicine

## 2021-01-08 NOTE — Telephone Encounter (Signed)
Post ED Visit - Positive Culture Follow-up  Culture report reviewed by antimicrobial stewardship pharmacist: [facility] []  , Pharm.D. []  Enzo Bi, Pharm.D., BCPS AQ-ID []  , Pharm.D., BCPS [x]  Celedonio Miyamoto, Pharm.D., BCPS []  [place], Garvin Fila.D., BCPS, AAHIVP []  , Pharm.D., BCPS, AAHIVP []  Georgina Pillion, PharmD, BCPS []  , PharmD, BCPS []  [place], PharmD, BCPS []  [place], PharmD []  , PharmD, BCPS []  Estella Husk, PharmD  Pharmacy Team []  Lysle Pearl, PharmD []  , PharmD []  Phillips Climes, PharmD []  , Rph []  Agapito Games) , PharmD []  Verlan Friends, PharmD []  , PharmD []  Mervyn Gay, PharmD []  , PharmD []  Vinnie Level, PharmD []  [facility], PharmD []  , PharmD []  Len Childs, PharmD   Positive urine culture Treated with amoxicillin-pot clavulanate, organism sensitive to the same and no further patient follow-up is required at this time.  01/08/2021, 8:44 AM

## 2021-01-11 LAB — CULTURE, BLOOD (ROUTINE X 2)
Culture: NO GROWTH
Culture: NO GROWTH
Special Requests: ADEQUATE
Special Requests: ADEQUATE

## 2021-09-01 DIAGNOSIS — Z1151 Encounter for screening for human papillomavirus (HPV): Secondary | ICD-10-CM | POA: Diagnosis not present

## 2021-09-01 DIAGNOSIS — Z124 Encounter for screening for malignant neoplasm of cervix: Secondary | ICD-10-CM | POA: Diagnosis not present

## 2021-09-01 DIAGNOSIS — K219 Gastro-esophageal reflux disease without esophagitis: Secondary | ICD-10-CM | POA: Diagnosis not present

## 2021-09-01 DIAGNOSIS — Z0000 Encounter for general adult medical examination without abnormal findings: Secondary | ICD-10-CM | POA: Diagnosis not present

## 2021-09-01 DIAGNOSIS — Z13220 Encounter for screening for lipoid disorders: Secondary | ICD-10-CM | POA: Diagnosis not present

## 2021-12-22 DIAGNOSIS — M25511 Pain in right shoulder: Secondary | ICD-10-CM | POA: Diagnosis not present

## 2021-12-22 DIAGNOSIS — M25532 Pain in left wrist: Secondary | ICD-10-CM | POA: Diagnosis not present

## 2022-01-12 DIAGNOSIS — M25521 Pain in right elbow: Secondary | ICD-10-CM | POA: Diagnosis not present

## 2022-01-12 DIAGNOSIS — M25511 Pain in right shoulder: Secondary | ICD-10-CM | POA: Diagnosis not present

## 2022-01-12 DIAGNOSIS — M25532 Pain in left wrist: Secondary | ICD-10-CM | POA: Diagnosis not present

## 2022-01-12 DIAGNOSIS — M25621 Stiffness of right elbow, not elsewhere classified: Secondary | ICD-10-CM | POA: Diagnosis not present

## 2022-02-02 DIAGNOSIS — M25521 Pain in right elbow: Secondary | ICD-10-CM | POA: Diagnosis not present

## 2022-02-02 DIAGNOSIS — M25532 Pain in left wrist: Secondary | ICD-10-CM | POA: Diagnosis not present

## 2022-04-13 DIAGNOSIS — H5213 Myopia, bilateral: Secondary | ICD-10-CM | POA: Diagnosis not present

## 2022-05-05 IMAGING — US US MFM OB COMPLETE +14 WKS
1 series · 15 of 28 positions shown · non-contrast
Comparison: none

[Series 1: us mfm ob complete +14 wks · 15 of 29 slices shown]
[im 1/29]
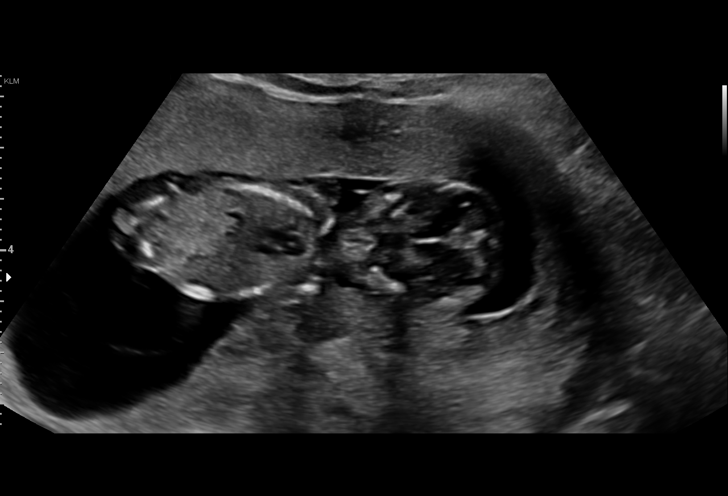
[im 3/29]
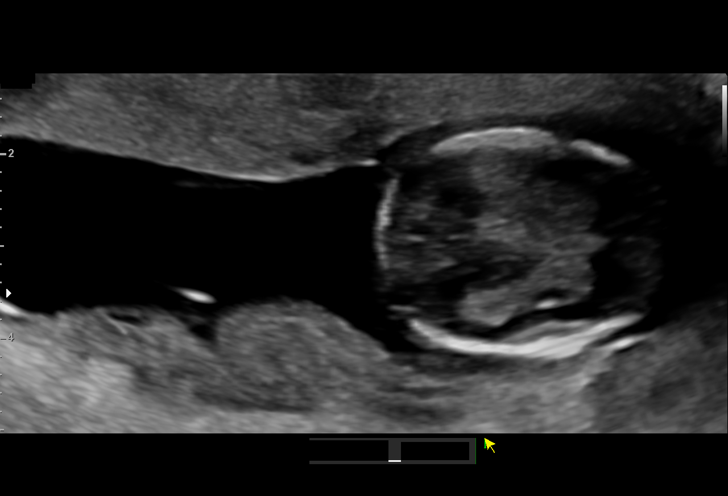
[im 5/29]
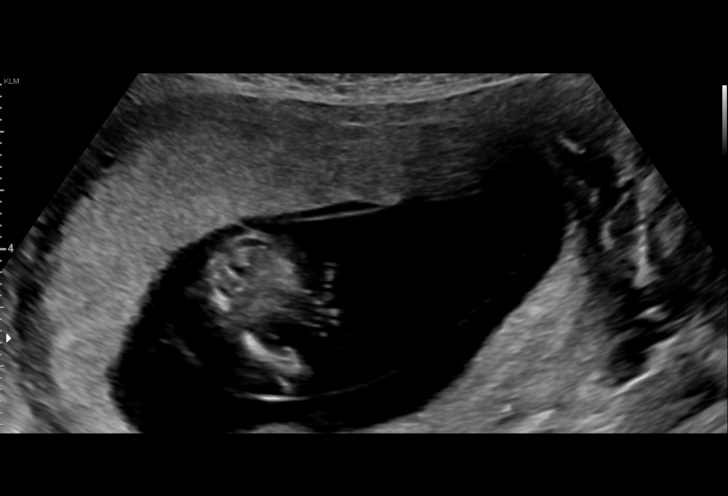
[im 7/29]
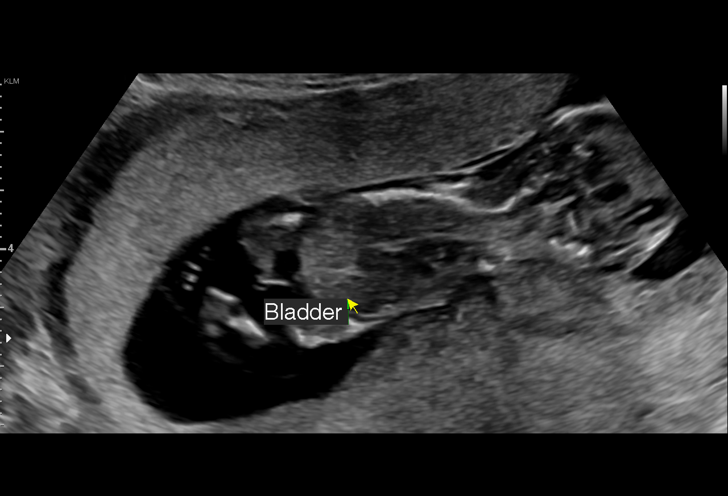
[im 9/29]
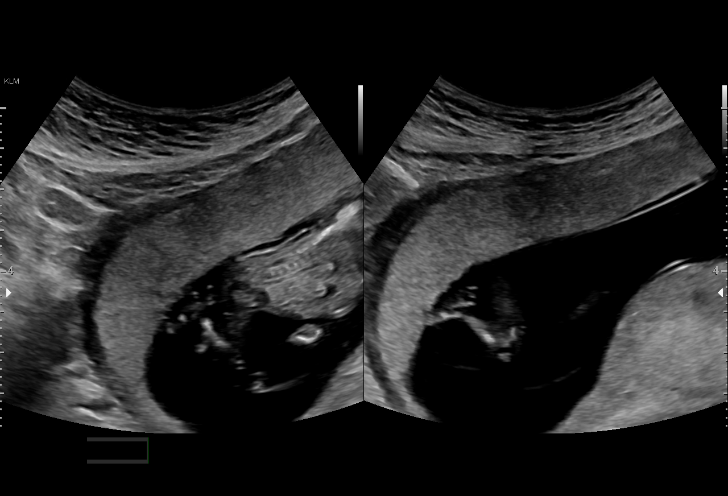
[im 11/29]
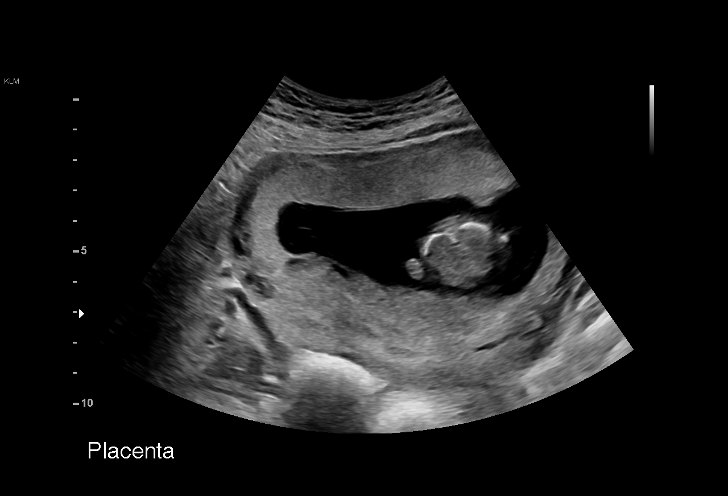
[im 13/29]
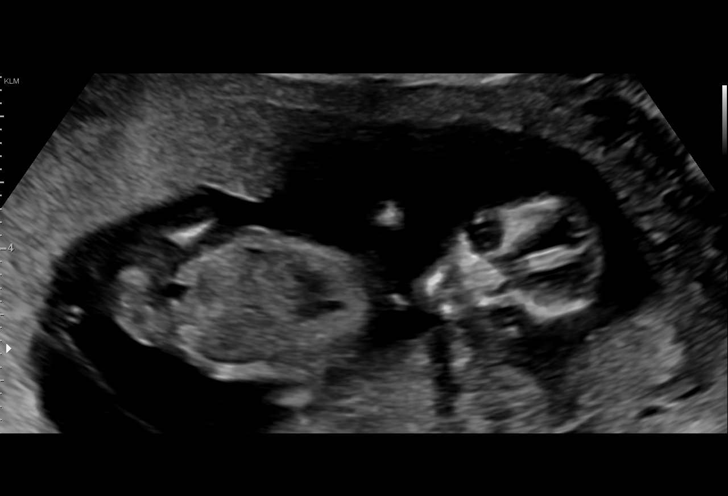
[im 15/29]
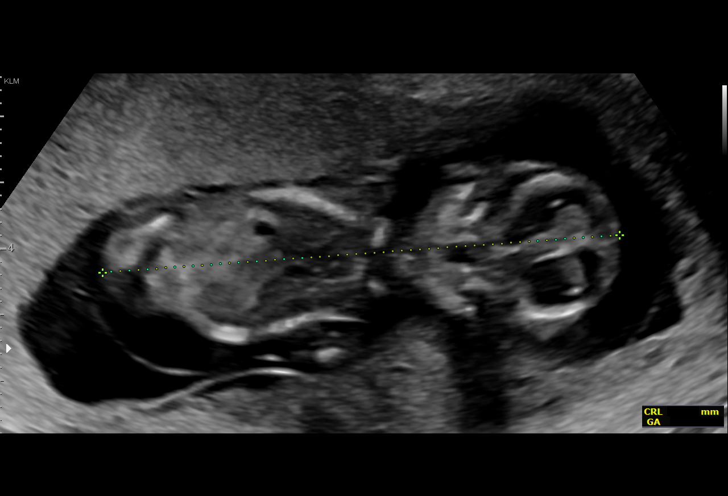
[im 16/29]
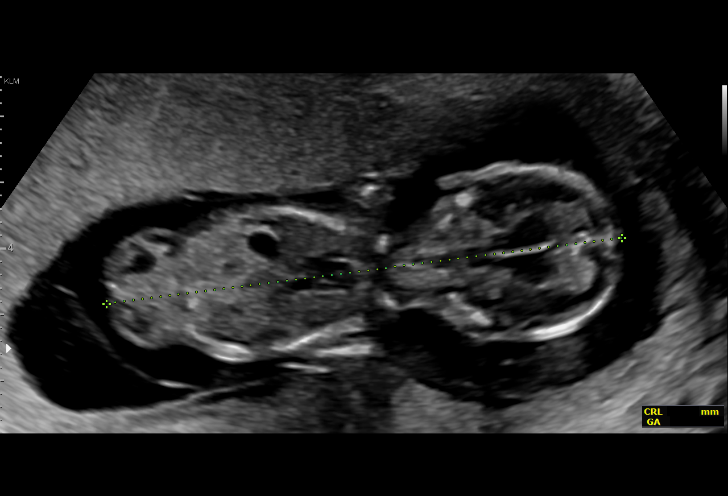
[im 18/29]
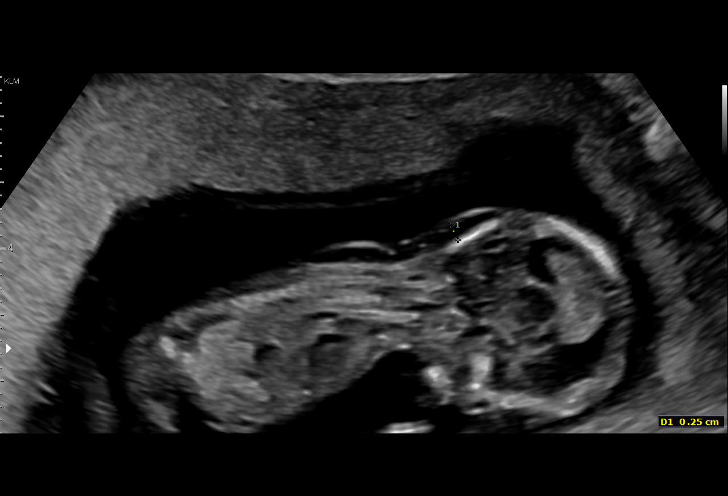
[im 20/29]
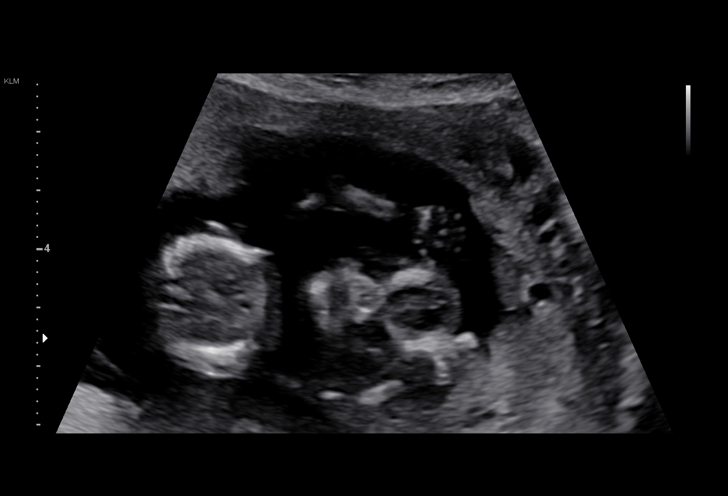
[im 22/29]
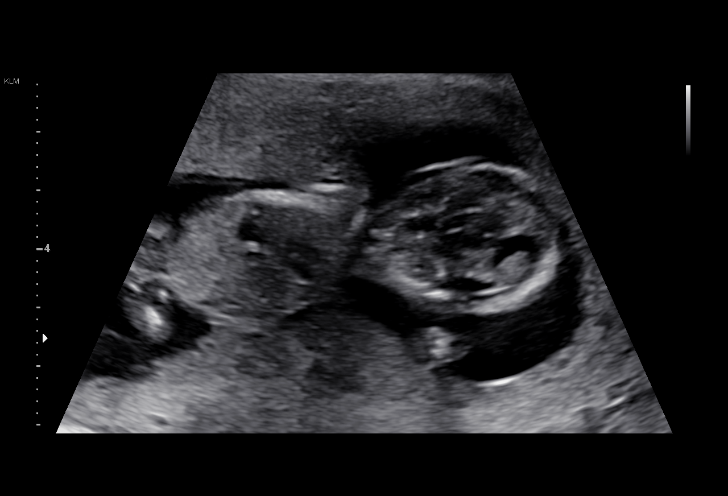
[im 24/29]
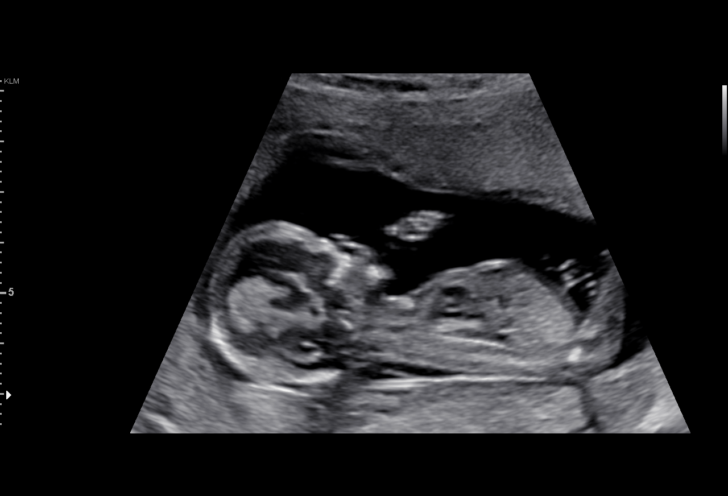
[im 26/29]
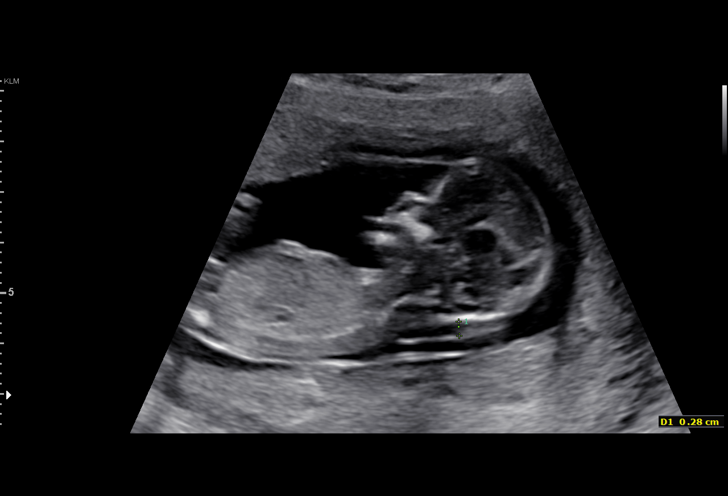
[im 29/29]
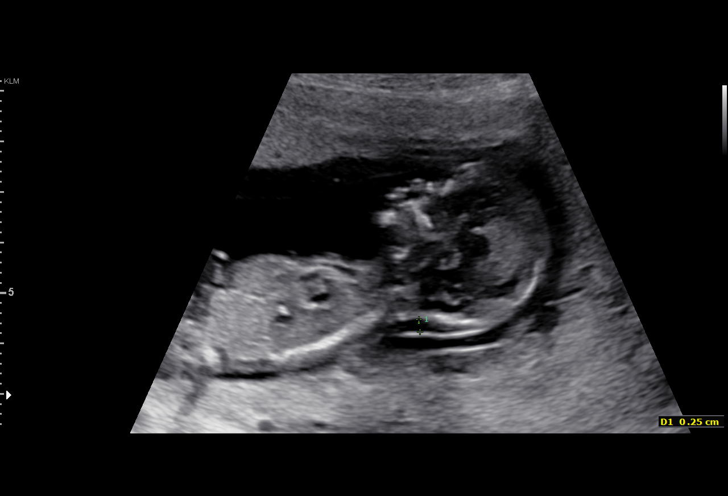

[15 of 28 positions shown; findings below may reference images not displayed]

[address]

 1   US MFM OB COMP LESS THAN 14          76801.4      KLPIGBB MOOLMAN
     WEEKS

Indications

 Encounter for antenatal screening for
 malformations
 Advanced maternal age multigravida 37+, third
 trimester
 13 weeks gestation of pregnancy
 Low risk NIPS
Fetal Evaluation

 Num Of Fetuses:          1
 Fetal Heart Rate(bpm):   161
 Cardiac Activity:        Observed
 Presentation:            Variable
 Placenta:                Anterior

 Amniotic Fluid
 AFI FV:      Within normal limits
OB History

 Gravidity:     4         Term:  2          Prem:  0        SAB:   1
 TOP:           0       Ectopic: 0         Living: 2
Gestational Age

 LMP:            13w 2d       Date:  02/06/19                   EDD:  11/13/19
 Best:           13w 2d    Det. By:  LMP  (02/06/19)            EDD:  11/13/19
1st Trimester Genetic Sonogram Screening
 CRL:            78.7   mm    G. Age:   13w 4d                  EDD:  11/11/19
 Nuc Trans:        2.8  mm

 Nasal Bone:                  Present
Anatomy

 Cranium:                Appears normal         Abdominal Wall:         Appears nml (cord
                                                                        insert, abd wall)
 Choroid Plexus:         Appears normal         Bladder:                Appears normal
 Stomach:                Appears normal, left   Upper Extremities:      Visualized
                         sided
 Abdomen:                Appears normal         Lower Extremities:      Visualized
Cervix Uterus Adnexa

 Cervix
 Closed.

 Uterus
 No abnormality visualized.
Impression

 First trimester anatomy
 Normal NT of
 Low risk NIPS
 Good amniotic fluid
Recommendations

 Detailed anatomy at 18-20 weeks

## 2022-05-29 IMAGING — US US OB TRANSVAGINAL
1 series · 14 of 28 positions shown · non-contrast
Comparison: 05/10/2019

CLINICAL DATA: Vaginal bleeding, known pregnancy

EXAM:
LIMITED OBSTETRIC ULTRASOUND AND TRANSVAGINAL OBSTETRIC ULTRASOUND

[Series 1: us ob transvaginal · 71 acquisitions, 14 frames shown]
[im 3/71]
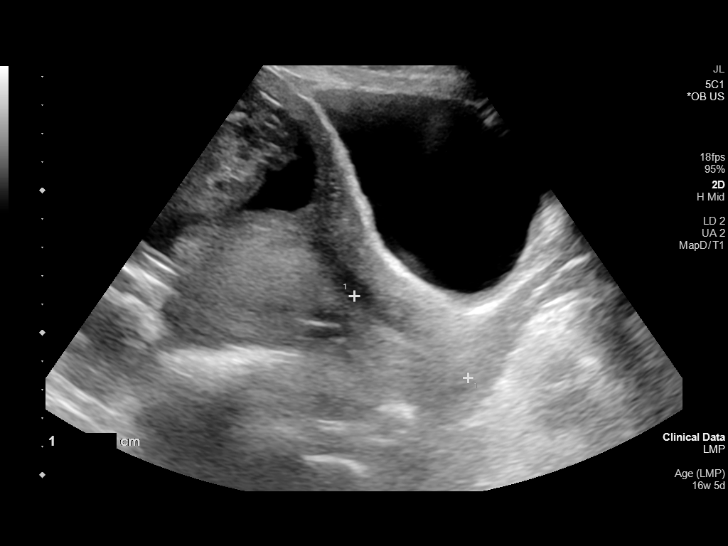
[im 8/71]
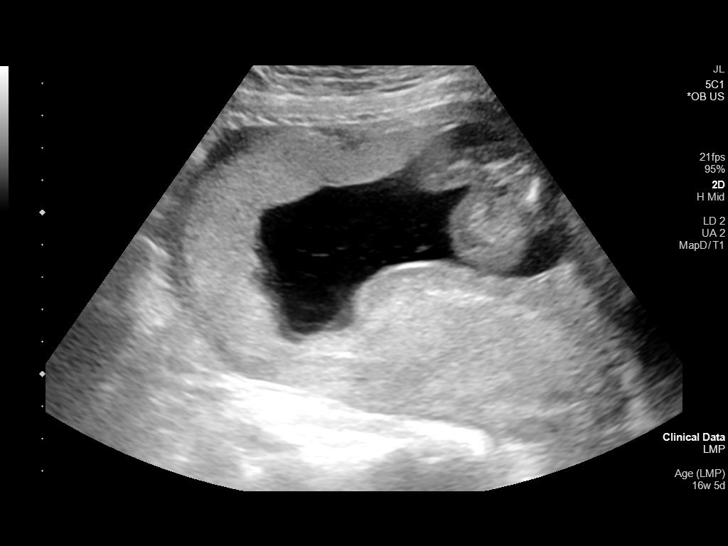
[im 13/71]
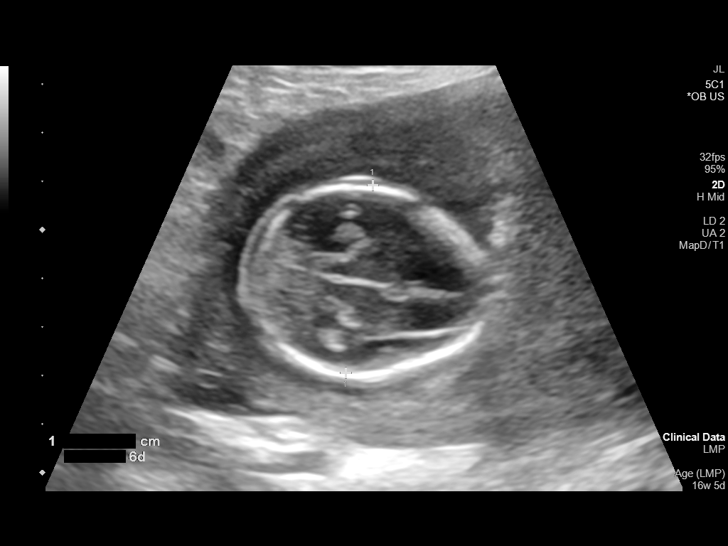
[im 19/71]
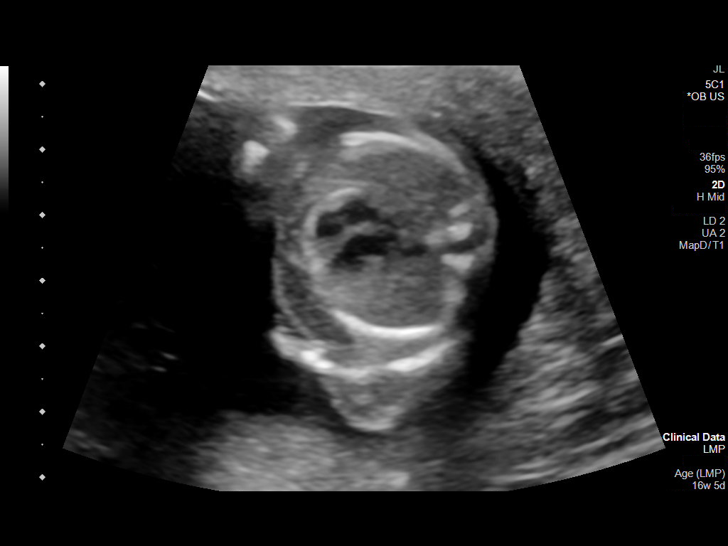
[im 24/71]
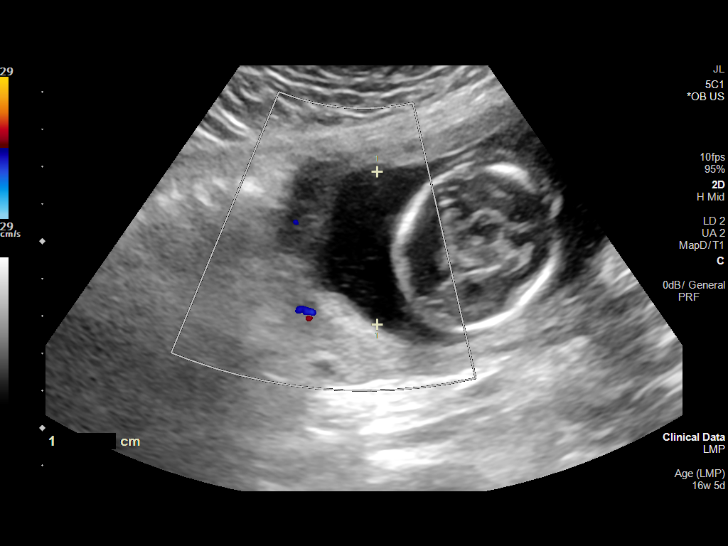
[im 29/71]
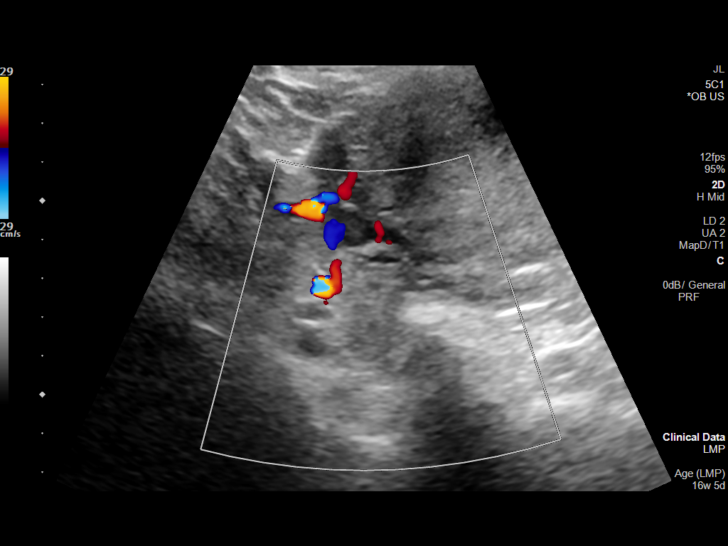
[im 34/71]
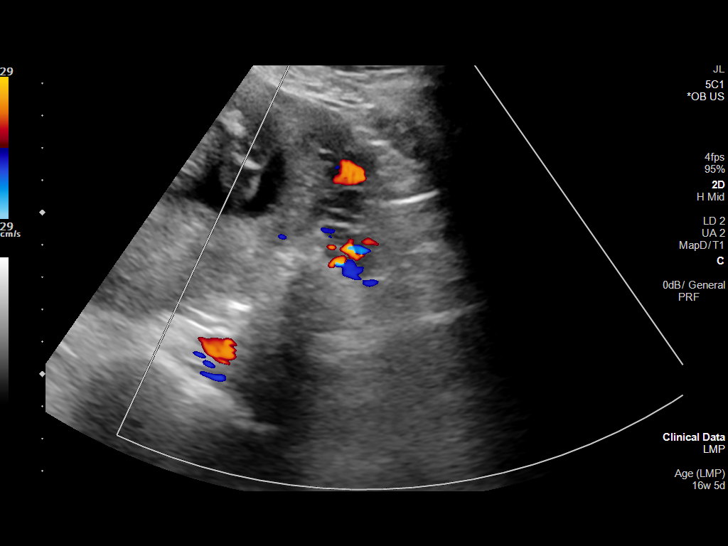
[im 39/71]
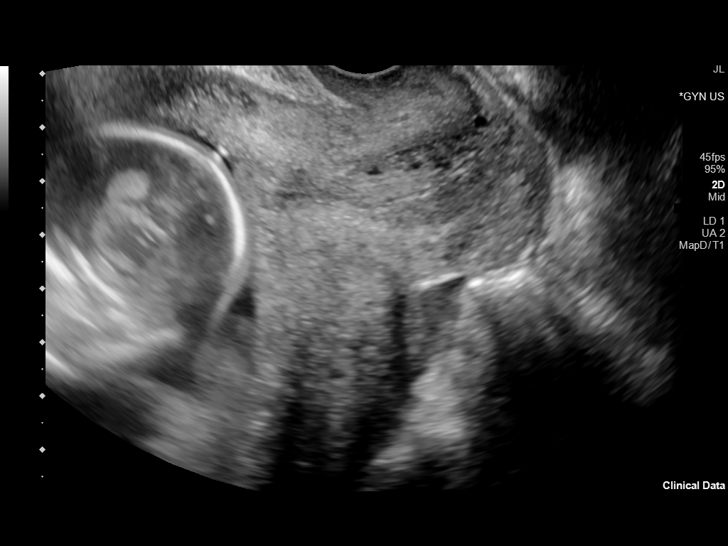
[im 45/71]
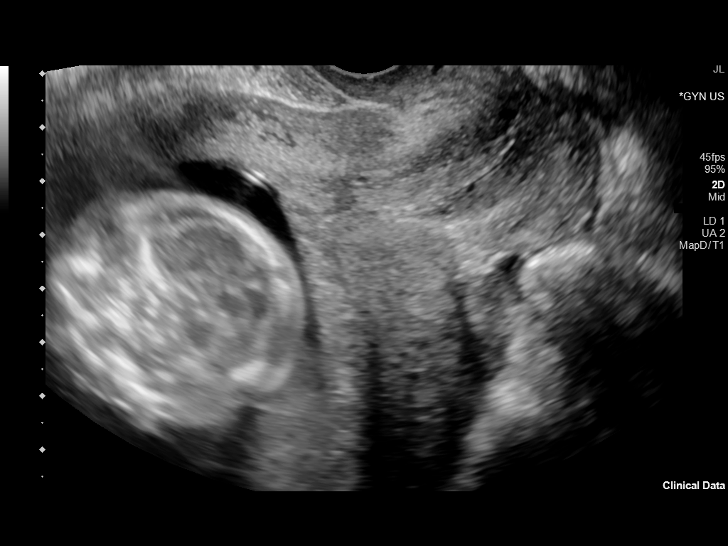
[im 50/71]
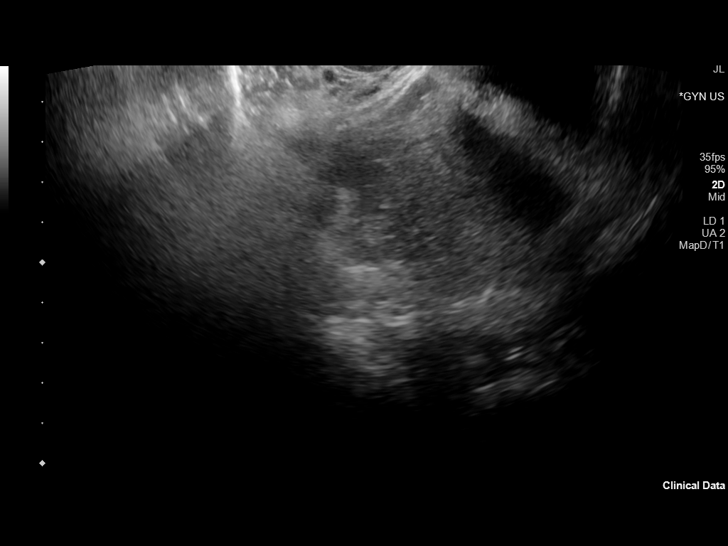
[im 55/71]
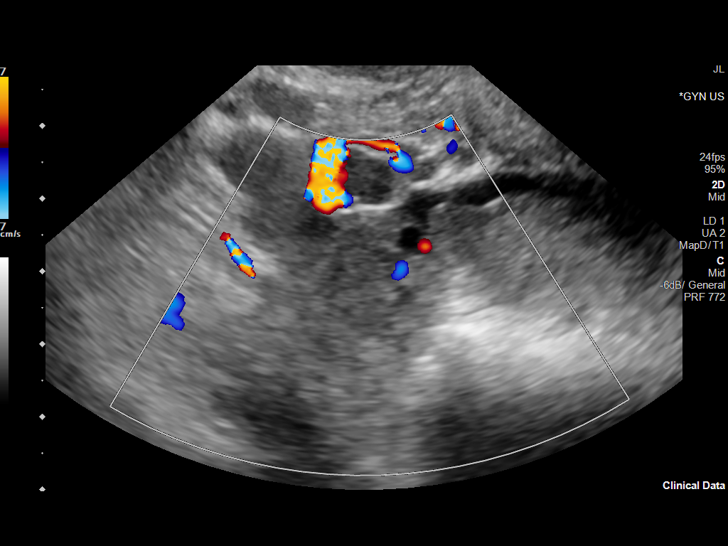
[im 60/71]
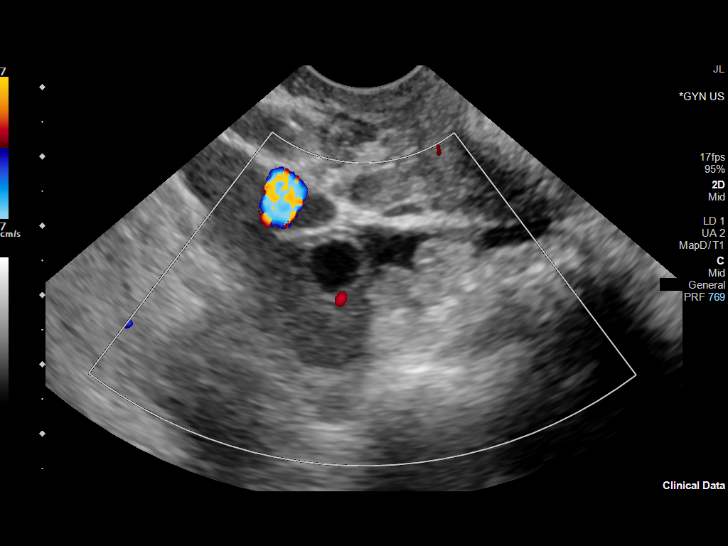
[im 65/71]
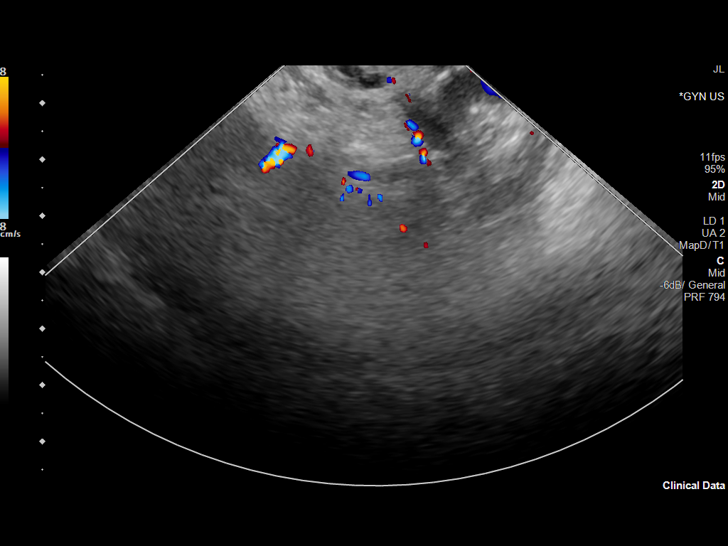
[im 71/71]
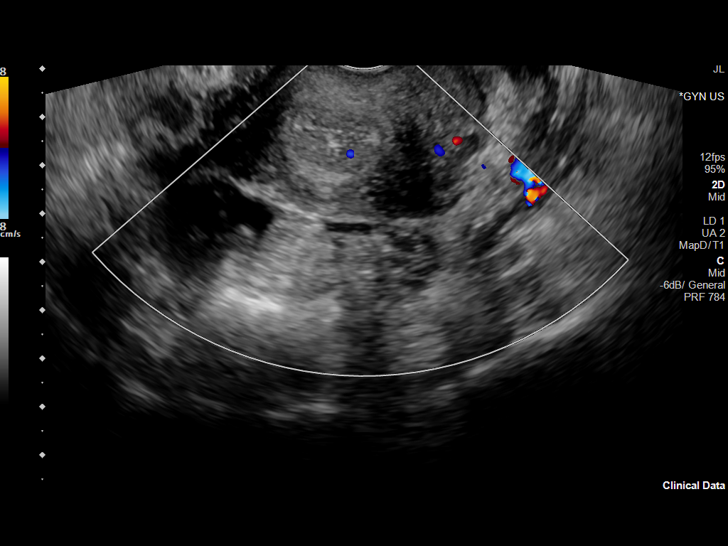

[14 of 28 positions shown; findings below may reference images not displayed]

FINDINGS: Number of Fetuses: 1

Heart Rate:  159 bpm

Movement: Present

Presentation: Variable

Placental Location: Anterior fundal

Previa: Absent

Amniotic Fluid (Subjective): Normal

BPD:  3.9cm 17w 6d

MATERNAL FINDINGS:

Cervix:  Appears closed.

Uterus/Adnexae:  Minimal free fluid is noted within the pelvis.
IMPRESSION: Single live intrauterine gestation at 17 weeks 6 days. No acute
abnormality is noted.

This exam is performed on an emergent basis and does not
comprehensively evaluate fetal size, dating, or anatomy; follow-up
complete OB US should be considered if further fetal assessment is
warranted.

## 2022-05-29 IMAGING — US US OB LIMITED
1 series · 14 of 28 positions shown · non-contrast
Comparison: 05/10/2019

CLINICAL DATA: Vaginal bleeding, known pregnancy

EXAM:
LIMITED OBSTETRIC ULTRASOUND AND TRANSVAGINAL OBSTETRIC ULTRASOUND

[Series 1: us ob limited · 71 acquisitions, 14 frames shown]
[im 3/71]
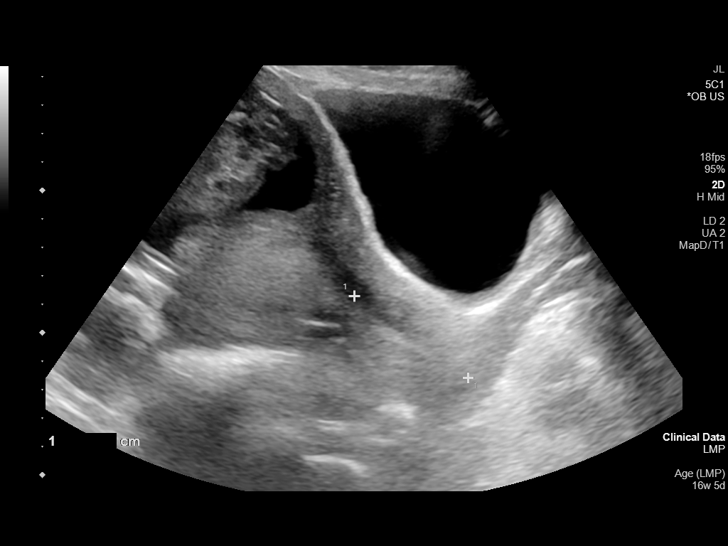
[im 8/71]
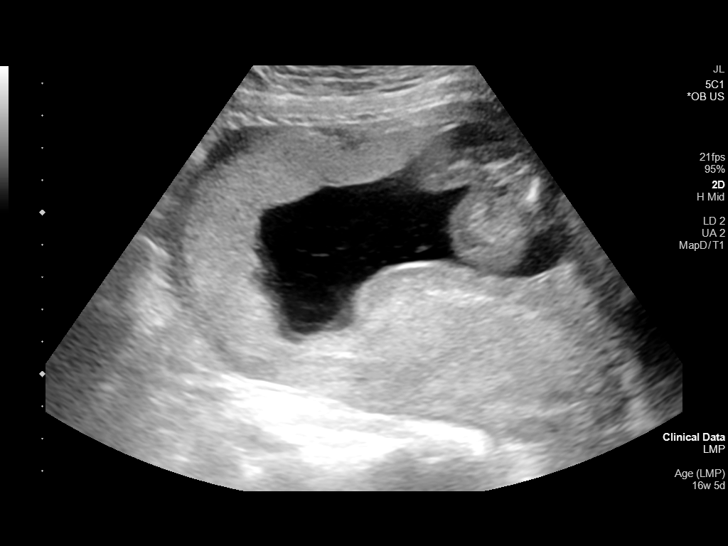
[im 13/71]
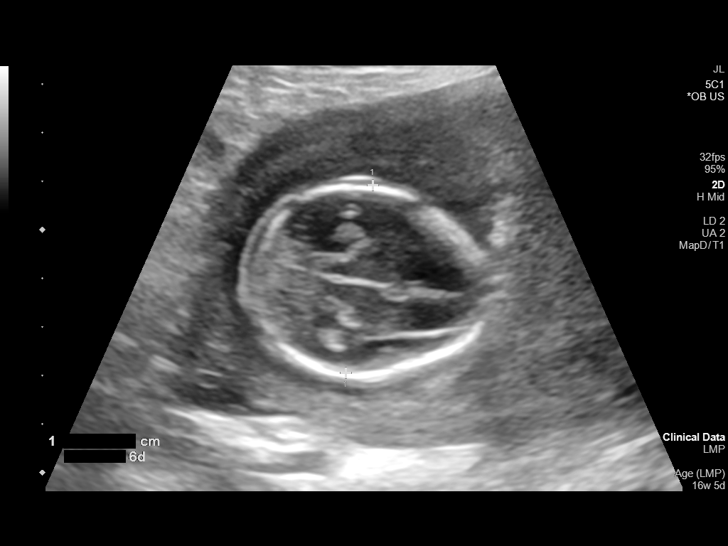
[im 19/71]
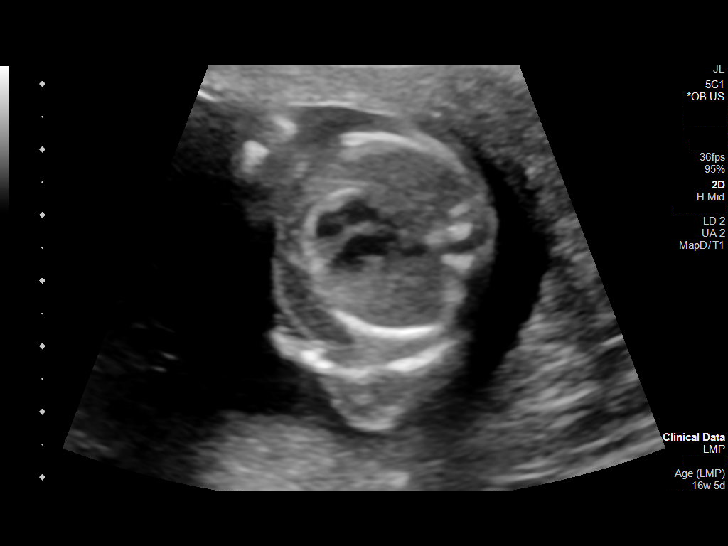
[im 24/71]
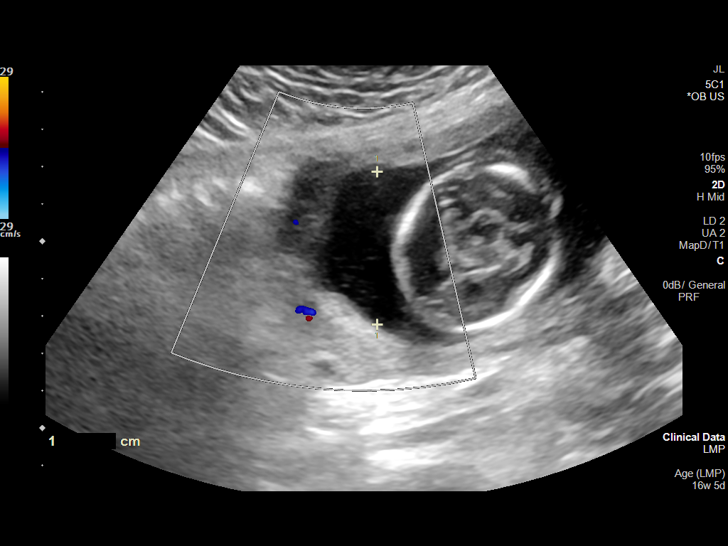
[im 29/71]
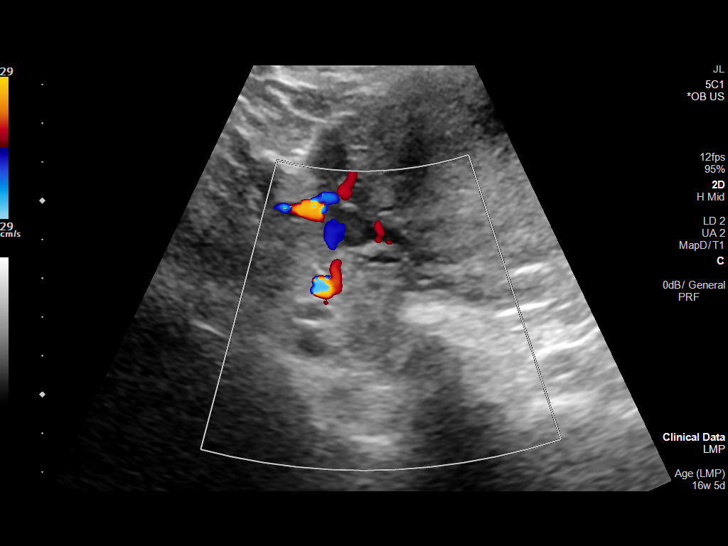
[im 34/71]
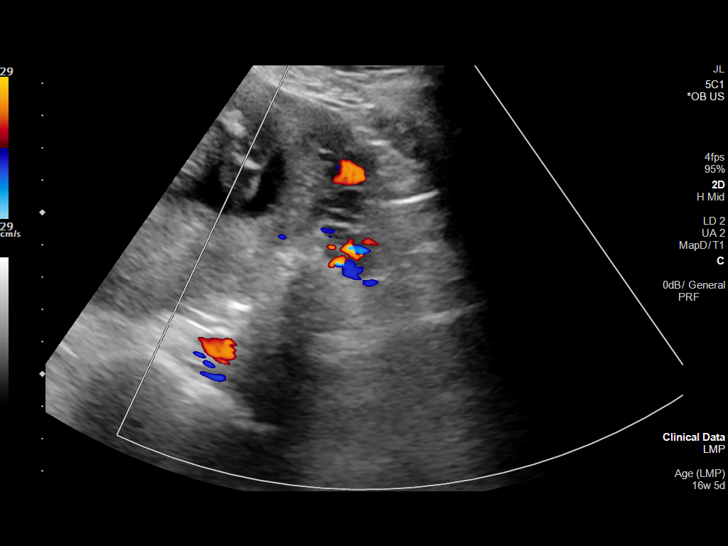
[im 39/71]
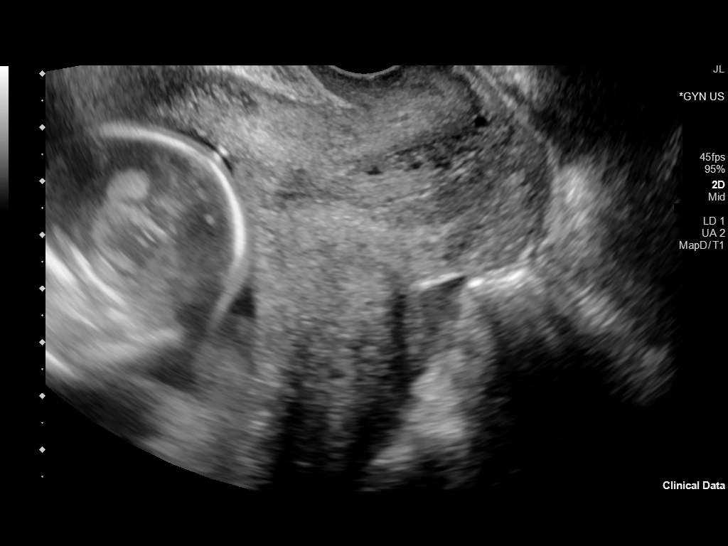
[im 45/71]
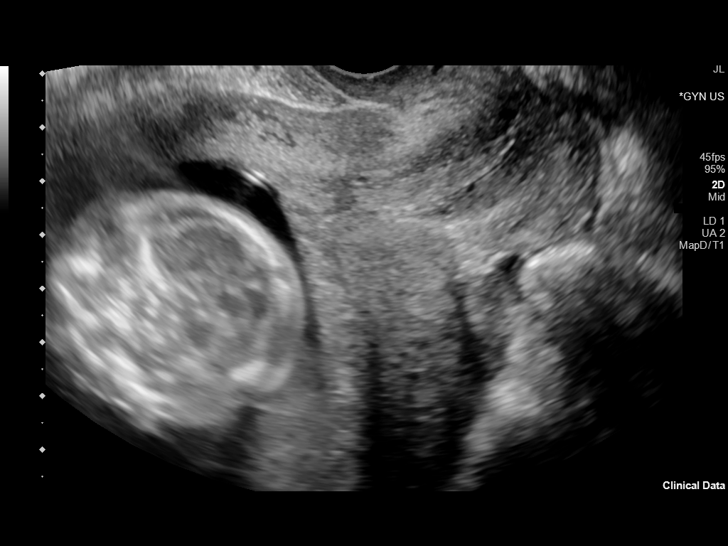
[im 50/71]
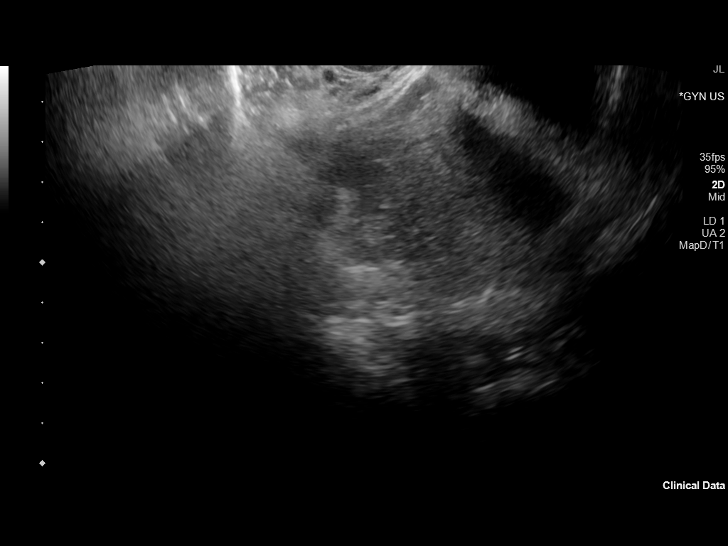
[im 55/71]
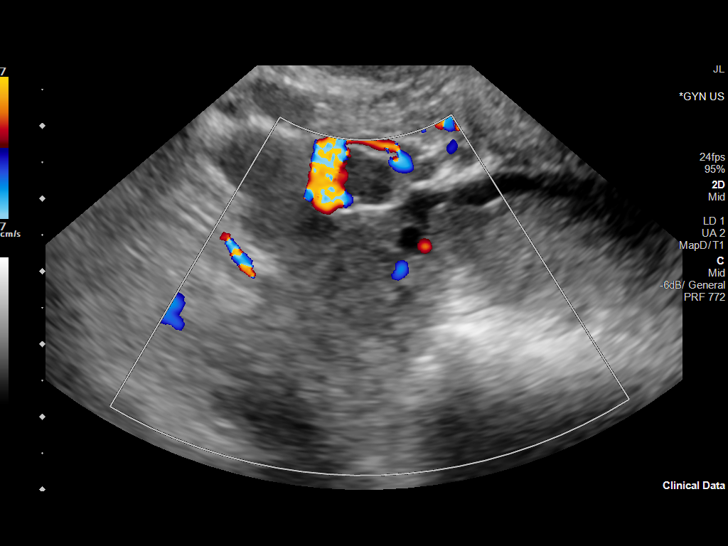
[im 60/71]
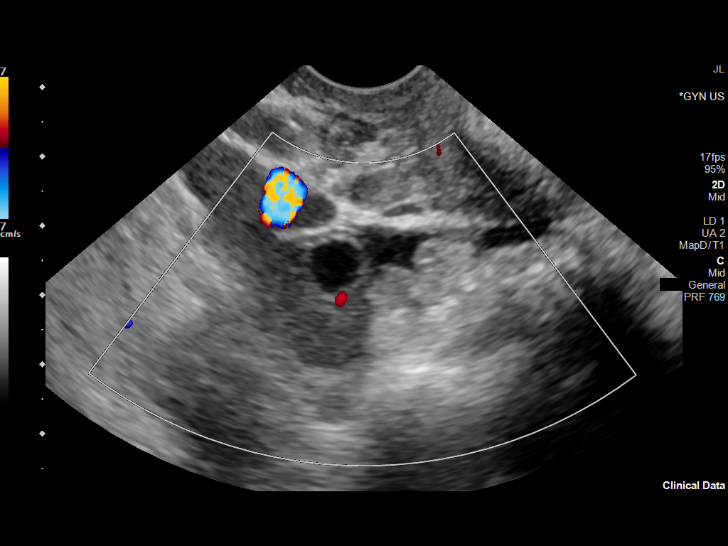
[im 65/71]
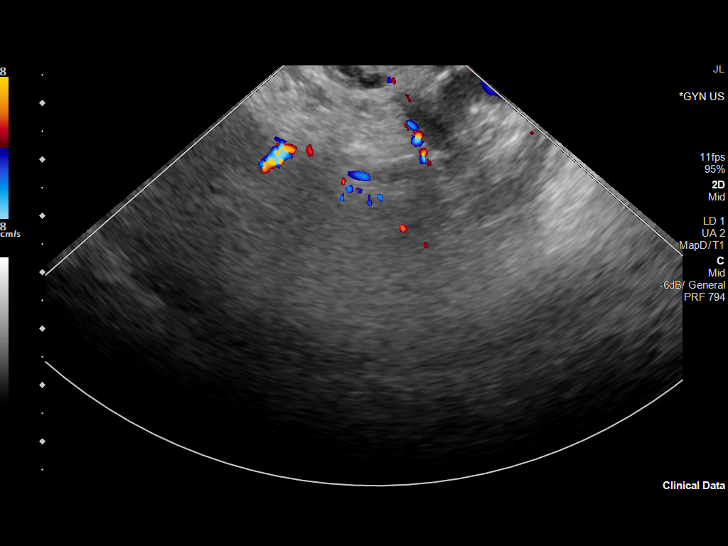
[im 71/71]
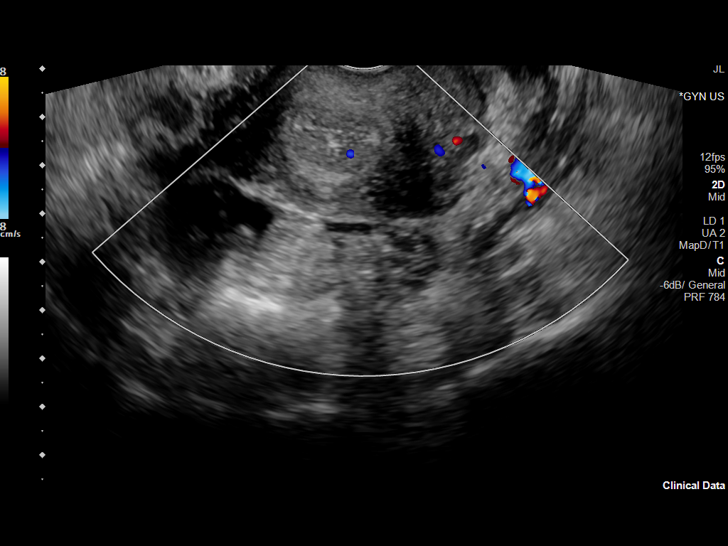

[14 of 28 positions shown; findings below may reference images not displayed]

FINDINGS: Number of Fetuses: 1

Heart Rate:  159 bpm

Movement: Present

Presentation: Variable

Placental Location: Anterior fundal

Previa: Absent

Amniotic Fluid (Subjective): Normal

BPD:  3.9cm 17w 6d

MATERNAL FINDINGS:

Cervix:  Appears closed.

Uterus/Adnexae:  Minimal free fluid is noted within the pelvis.
IMPRESSION: Single live intrauterine gestation at 17 weeks 6 days. No acute
abnormality is noted.

This exam is performed on an emergent basis and does not
comprehensively evaluate fetal size, dating, or anatomy; follow-up
complete OB US should be considered if further fetal assessment is
warranted.

## 2022-08-20 DIAGNOSIS — R079 Chest pain, unspecified: Secondary | ICD-10-CM | POA: Diagnosis not present

## 2022-08-20 DIAGNOSIS — K219 Gastro-esophageal reflux disease without esophagitis: Secondary | ICD-10-CM | POA: Diagnosis not present

## 2022-09-14 DIAGNOSIS — Z113 Encounter for screening for infections with a predominantly sexual mode of transmission: Secondary | ICD-10-CM | POA: Diagnosis not present

## 2022-09-14 DIAGNOSIS — Z0000 Encounter for general adult medical examination without abnormal findings: Secondary | ICD-10-CM | POA: Diagnosis not present

## 2022-09-14 DIAGNOSIS — Z1211 Encounter for screening for malignant neoplasm of colon: Secondary | ICD-10-CM | POA: Diagnosis not present

## 2022-09-14 DIAGNOSIS — Z13220 Encounter for screening for lipoid disorders: Secondary | ICD-10-CM | POA: Diagnosis not present

## 2022-09-14 DIAGNOSIS — Z831 Family history of other infectious and parasitic diseases: Secondary | ICD-10-CM | POA: Diagnosis not present

## 2022-09-14 DIAGNOSIS — Z1231 Encounter for screening mammogram for malignant neoplasm of breast: Secondary | ICD-10-CM | POA: Diagnosis not present

## 2022-10-05 IMAGING — US US MFM FETAL BPP W/O NON-STRESS
1 series · 13 of 28 positions shown · non-contrast
Comparison: none

[Series 1: us mfm fetal bpp w/o non-stress · 31 acquisitions, 13 frames shown]
[im 2/31]
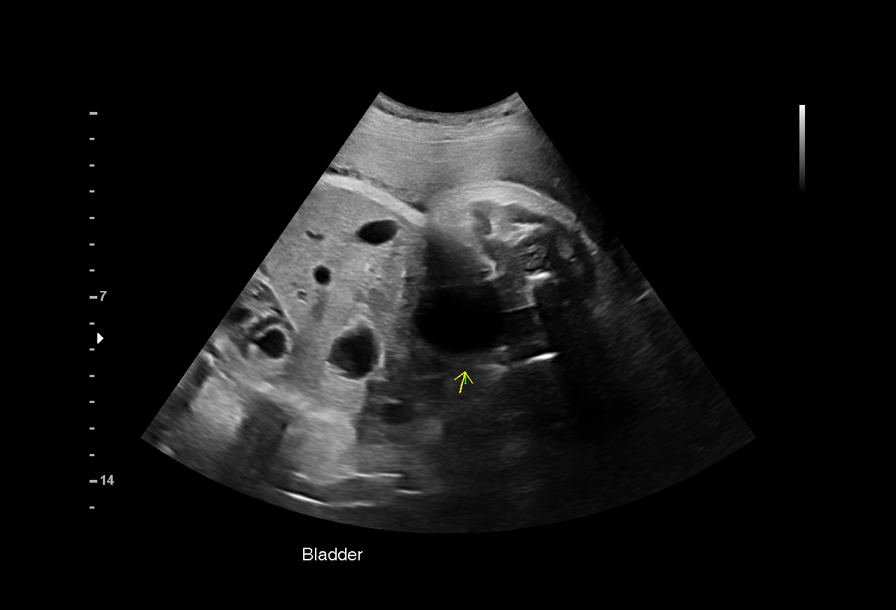
[im 4/31]
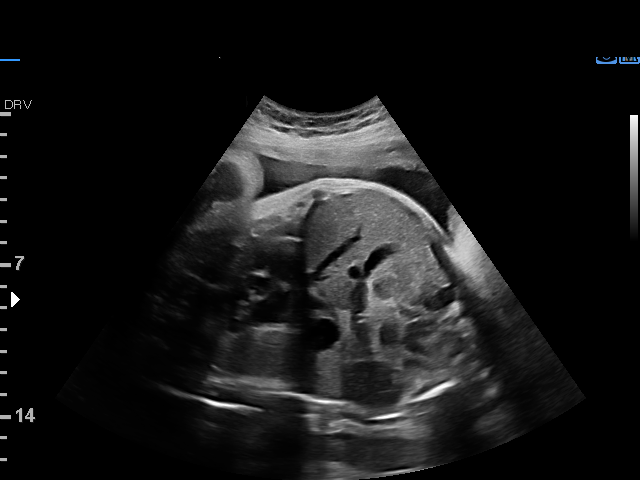
[im 6/31]
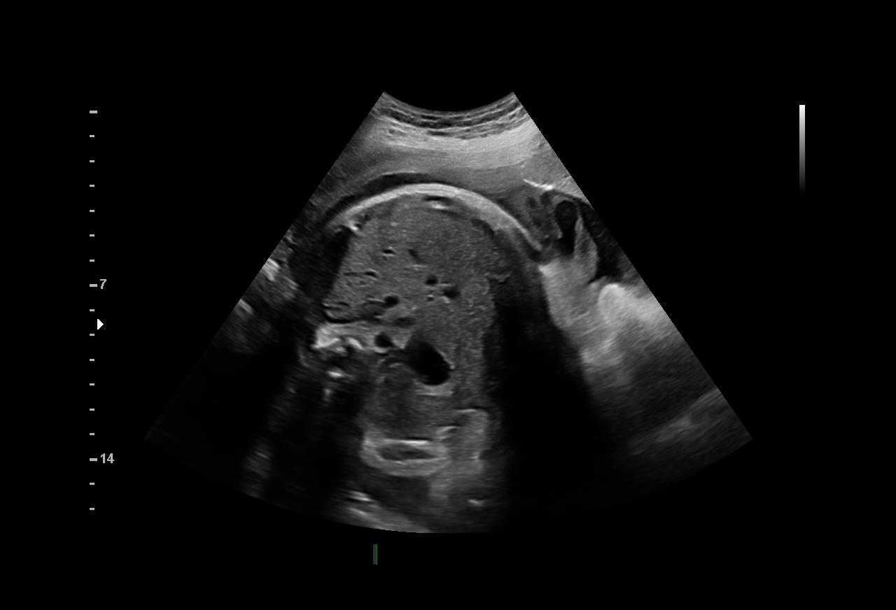
[im 8/31]
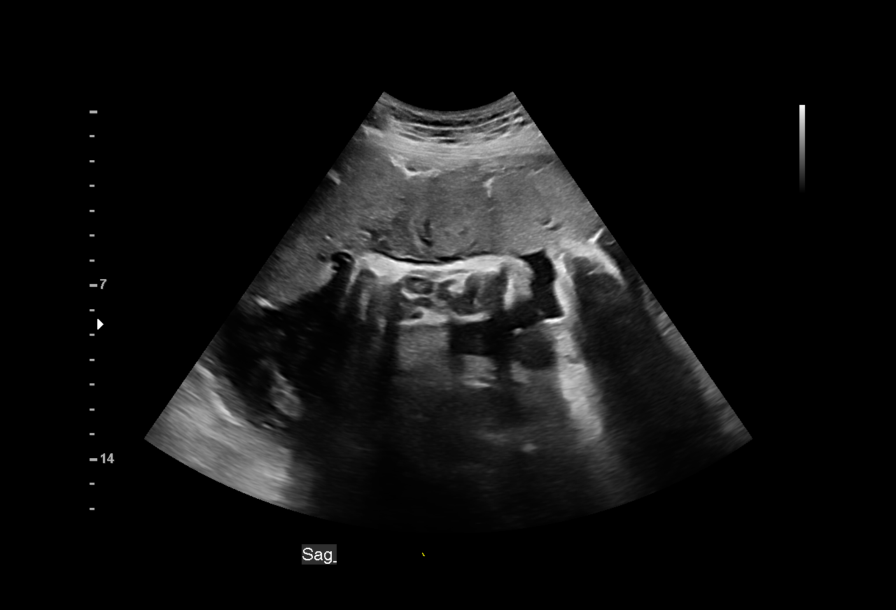
[im 11/31]
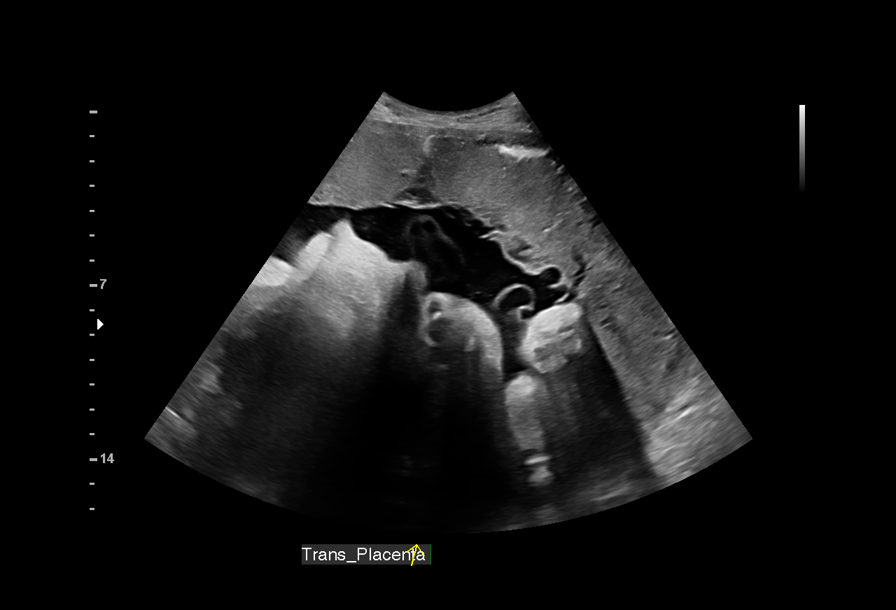
[im 13/31]
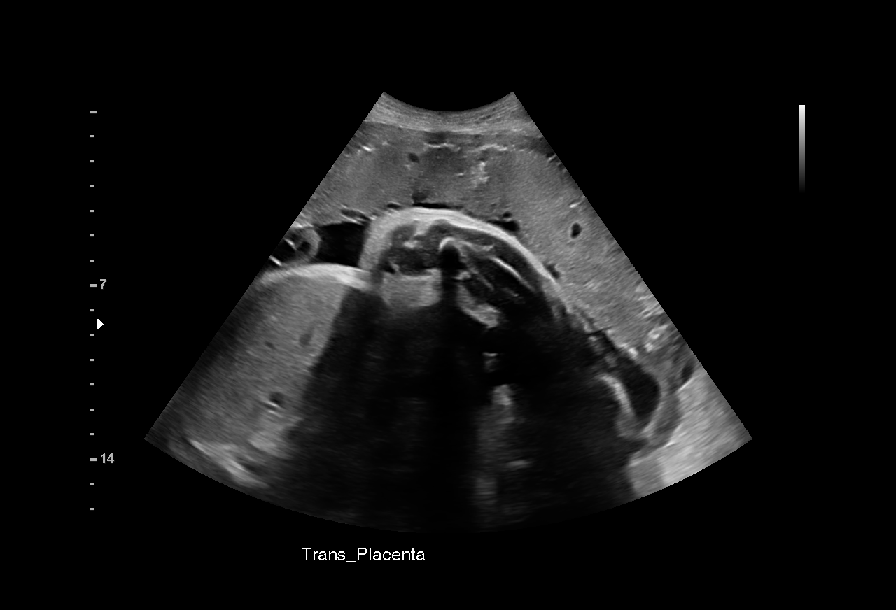
[im 16/31]
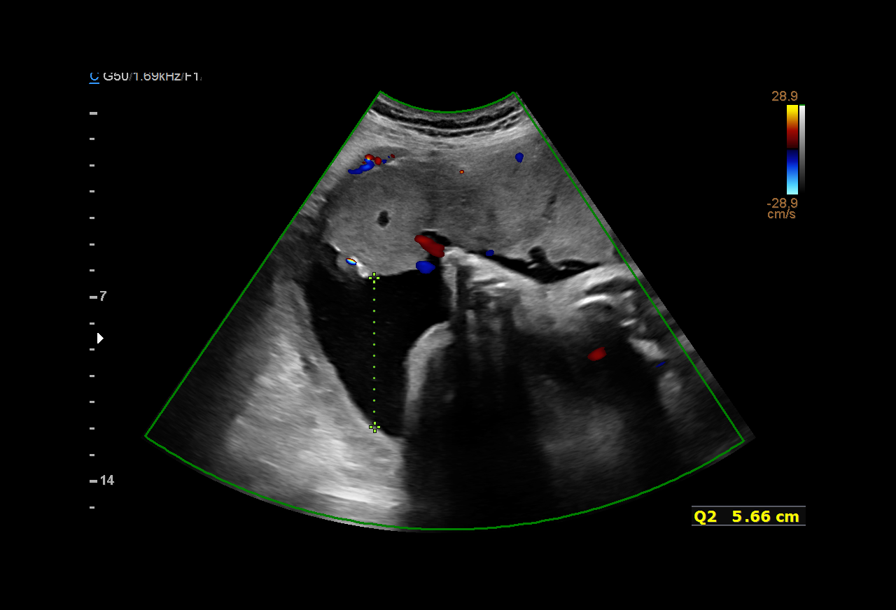
[im 18/31]
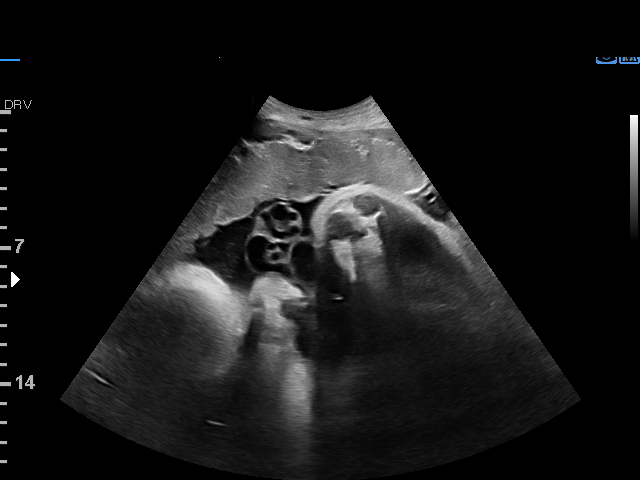
[im 21/31]
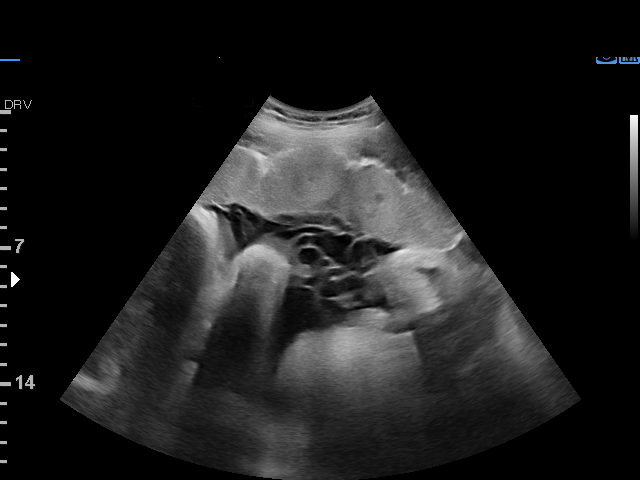
[im 23/31]
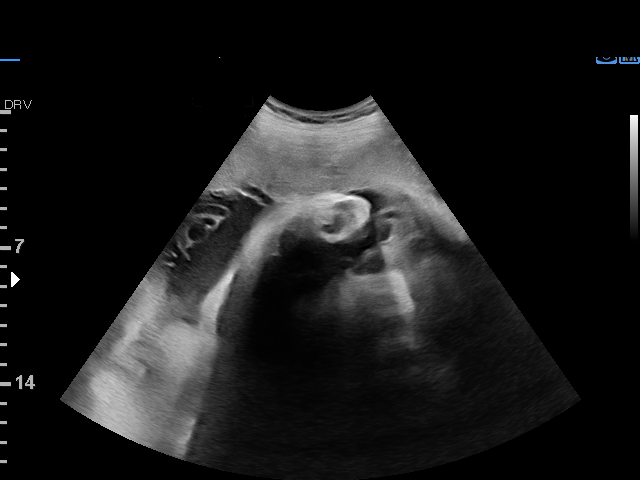
[im 25/31]
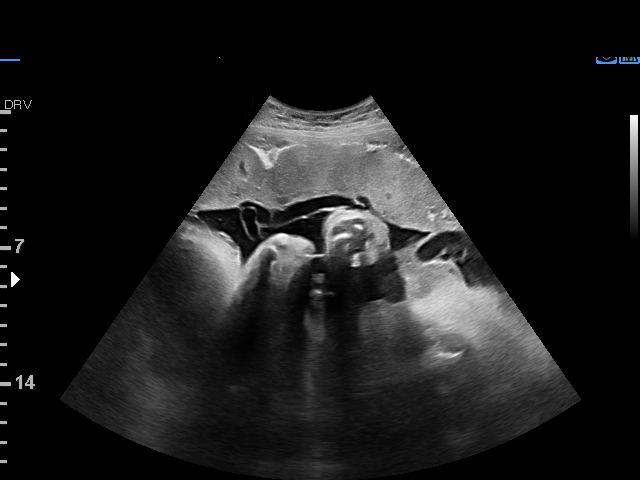
[im 27/31]
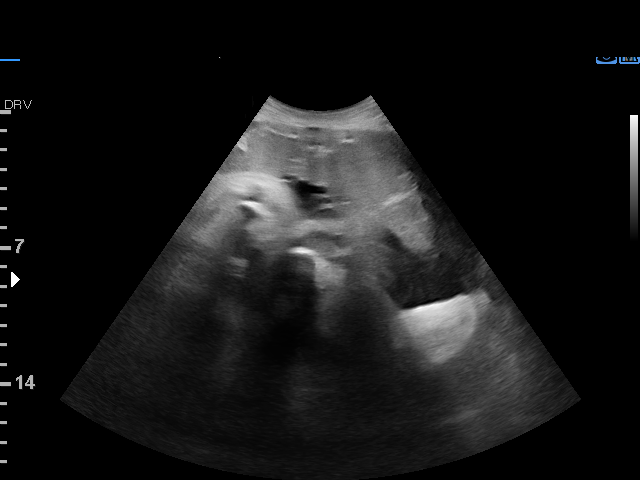
[im 29/31]
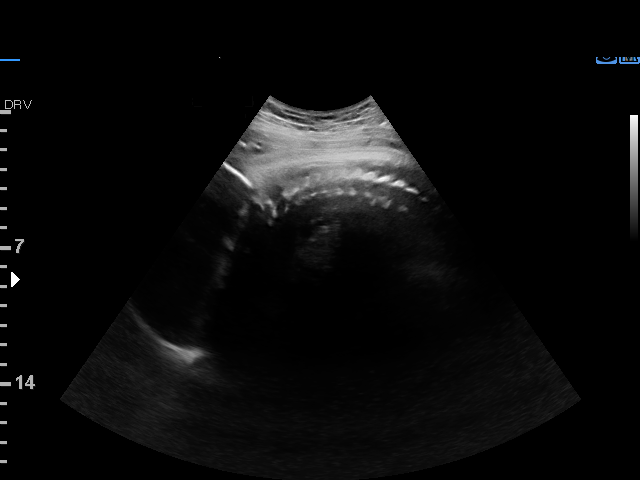

[13 of 28 positions shown; findings below may reference images not displayed]

Indications

 Advanced maternal age multigravida 35+,
 third trimester (44 yr)
 35 weeks gestation of pregnancy
 Low risk NIPS
Fetal Evaluation

 Num Of Fetuses:         1
 Fetal Heart Rate(bpm):  126
 Cardiac Activity:       Observed
 Presentation:           Breech
 Placenta:               Anterior

 Amniotic Fluid
 AFI FV:      Within normal limits

 AFI Sum(cm)     %Tile       Largest Pocket(cm)
 13.18           44

 RUQ(cm)       RLQ(cm)       LUQ(cm)        LLQ(cm)

Biophysical Evaluation

 Amniotic F.V:   Pocket => 2 cm             F. Tone:        Observed
 F. Movement:    Observed                   Score:          [DATE]
 F. Breathing:   Observed
OB History

 Gravidity:    4         Term:   2        Prem:   0        SAB:   1
 TOP:          0       Ectopic:  0        Living: 2
Gestational Age

 LMP:           35w 1d        Date:  02/06/19                 EDD:   11/13/19
 Best:          35w 1d     Det. By:  LMP  (02/06/19)          EDD:   11/13/19
Cervix Uterus Adnexa

 Cervix
 Not visualized (advanced GA >96wks)
Impression

 Advanced maternal age.  Patient is here for antenatal testing.
 Patient does not have gestational diabetes.  Blood pressure
 today at our office is 105/68 mmHg.

 Amniotic fluid is normal and good fetal activity is seen
 .Antenatal testing is reassuring. BPP [DATE].  Breech
 presentation.
Recommendations

 -Continue weekly BPP till delivery.
 -Fetal growth assessment in 2 weeks.
                 [place], [place]

## 2022-11-23 DIAGNOSIS — Z1231 Encounter for screening mammogram for malignant neoplasm of breast: Secondary | ICD-10-CM | POA: Diagnosis not present

## 2022-12-08 DIAGNOSIS — R928 Other abnormal and inconclusive findings on diagnostic imaging of breast: Secondary | ICD-10-CM | POA: Diagnosis not present

## 2022-12-08 DIAGNOSIS — N6002 Solitary cyst of left breast: Secondary | ICD-10-CM | POA: Diagnosis not present

## 2022-12-08 DIAGNOSIS — R92343 Mammographic extreme density, bilateral breasts: Secondary | ICD-10-CM | POA: Diagnosis not present

## 2022-12-08 DIAGNOSIS — N6012 Diffuse cystic mastopathy of left breast: Secondary | ICD-10-CM | POA: Diagnosis not present

## 2022-12-08 DIAGNOSIS — N6011 Diffuse cystic mastopathy of right breast: Secondary | ICD-10-CM | POA: Diagnosis not present

## 2022-12-14 DIAGNOSIS — N6452 Nipple discharge: Secondary | ICD-10-CM | POA: Diagnosis not present

## 2023-09-20 DIAGNOSIS — Z1231 Encounter for screening mammogram for malignant neoplasm of breast: Secondary | ICD-10-CM | POA: Diagnosis not present

## 2023-09-20 DIAGNOSIS — K219 Gastro-esophageal reflux disease without esophagitis: Secondary | ICD-10-CM | POA: Diagnosis not present

## 2023-09-20 DIAGNOSIS — Z0000 Encounter for general adult medical examination without abnormal findings: Secondary | ICD-10-CM | POA: Diagnosis not present

## 2023-09-20 DIAGNOSIS — Z131 Encounter for screening for diabetes mellitus: Secondary | ICD-10-CM | POA: Diagnosis not present

## 2023-09-20 DIAGNOSIS — Z1211 Encounter for screening for malignant neoplasm of colon: Secondary | ICD-10-CM | POA: Diagnosis not present

## 2023-09-20 DIAGNOSIS — Z13220 Encounter for screening for lipoid disorders: Secondary | ICD-10-CM | POA: Diagnosis not present

## 2023-10-13 DIAGNOSIS — N643 Galactorrhea not associated with childbirth: Secondary | ICD-10-CM | POA: Diagnosis not present

## 2023-10-13 DIAGNOSIS — Z131 Encounter for screening for diabetes mellitus: Secondary | ICD-10-CM | POA: Diagnosis not present

## 2023-10-13 DIAGNOSIS — N644 Mastodynia: Secondary | ICD-10-CM | POA: Diagnosis not present

## 2023-11-08 DIAGNOSIS — N643 Galactorrhea not associated with childbirth: Secondary | ICD-10-CM | POA: Diagnosis not present

## 2023-11-08 DIAGNOSIS — N644 Mastodynia: Secondary | ICD-10-CM | POA: Diagnosis not present
